# Patient Record
Sex: Female | Born: 1960 | Race: White | Hispanic: No | Marital: Married | Smoking: Never smoker
Health system: Southern US, Community
[De-identification: ages and names within clinical notes are randomized; demographics above are authoritative.]

## PROBLEM LIST (undated history)

## (undated) ENCOUNTER — Emergency Department: Admission: EM | Payer: Self-pay | Source: Home / Self Care

## (undated) DIAGNOSIS — N83209 Unspecified ovarian cyst, unspecified side: Secondary | ICD-10-CM

## (undated) DIAGNOSIS — I471 Supraventricular tachycardia, unspecified: Secondary | ICD-10-CM

## (undated) DIAGNOSIS — R112 Nausea with vomiting, unspecified: Secondary | ICD-10-CM

## (undated) DIAGNOSIS — Z0000 Encounter for general adult medical examination without abnormal findings: Secondary | ICD-10-CM

## (undated) DIAGNOSIS — F419 Anxiety disorder, unspecified: Secondary | ICD-10-CM

## (undated) DIAGNOSIS — I499 Cardiac arrhythmia, unspecified: Secondary | ICD-10-CM

## (undated) DIAGNOSIS — M545 Low back pain, unspecified: Secondary | ICD-10-CM

## (undated) DIAGNOSIS — M1990 Unspecified osteoarthritis, unspecified site: Secondary | ICD-10-CM

## (undated) DIAGNOSIS — N200 Calculus of kidney: Secondary | ICD-10-CM

## (undated) DIAGNOSIS — E039 Hypothyroidism, unspecified: Secondary | ICD-10-CM

## (undated) DIAGNOSIS — Z319 Encounter for procreative management, unspecified: Secondary | ICD-10-CM

## (undated) DIAGNOSIS — R519 Headache, unspecified: Secondary | ICD-10-CM

## (undated) DIAGNOSIS — F32A Depression, unspecified: Secondary | ICD-10-CM

## (undated) DIAGNOSIS — B009 Herpesviral infection, unspecified: Secondary | ICD-10-CM

## (undated) HISTORY — DX: Encounter for general adult medical examination without abnormal findings: Z00.00

## (undated) HISTORY — DX: Anxiety disorder, unspecified: F41.9

## (undated) HISTORY — DX: Herpesviral infection, unspecified: B00.9

## (undated) HISTORY — DX: Unspecified ovarian cyst, unspecified side: N83.209

## (undated) HISTORY — DX: Depression, unspecified: F32.A

## (undated) HISTORY — DX: Supraventricular tachycardia: I47.1

## (undated) HISTORY — DX: Supraventricular tachycardia, unspecified: I47.10

## (undated) HISTORY — DX: Encounter for procreative management, unspecified: Z31.9

---

## 2005-10-28 ENCOUNTER — Ambulatory Visit
Admit: 2005-10-28 | Disposition: A | Discharge status: Home / Self Care | Payer: Self-pay | Source: Ambulatory Visit | Admitting: Orthopaedic Surgery

## 2006-03-25 ENCOUNTER — Emergency Department: Admit: 2006-03-25 | Payer: Self-pay | Source: Emergency Department | Admitting: Emergency Medicine

## 2007-03-03 ENCOUNTER — Emergency Department: Admit: 2007-03-03 | Payer: Self-pay | Source: Emergency Department | Admitting: Emergency Medicine

## 2007-12-31 ENCOUNTER — Emergency Department: Admit: 2007-12-31 | Payer: Self-pay | Source: Emergency Department | Admitting: Emergency Medicine

## 2009-09-13 ENCOUNTER — Ambulatory Visit
Admit: 2009-09-13 | Disposition: A | Discharge status: Home / Self Care | Payer: Self-pay | Source: Ambulatory Visit | Admitting: Obstetrics & Gynecology

## 2010-03-15 ENCOUNTER — Ambulatory Visit: Admission: RE | Admit: 2010-03-15 | Payer: Self-pay | Source: Ambulatory Visit | Admitting: Orthopaedic Surgery

## 2010-04-17 ENCOUNTER — Emergency Department: Admit: 2010-04-17 | Payer: Self-pay | Source: Emergency Department | Admitting: Emergency Medicine

## 2010-05-26 ENCOUNTER — Ambulatory Visit
Admit: 2010-05-26 | Disposition: A | Discharge status: Home / Self Care | Payer: Self-pay | Source: Ambulatory Visit | Admitting: Physical Medicine & Rehabilitation

## 2011-06-03 ENCOUNTER — Ambulatory Visit
Admit: 2011-06-03 | Discharge: 2011-06-03 | Disposition: A | Discharge status: Home / Self Care | Payer: Self-pay | Source: Ambulatory Visit

## 2011-06-19 ENCOUNTER — Ambulatory Visit
Admit: 2011-06-19 | Discharge: 2011-06-19 | Disposition: A | Discharge status: Home / Self Care | Payer: Self-pay | Source: Ambulatory Visit

## 2011-09-05 ENCOUNTER — Ambulatory Visit
Admission: RE | Admit: 2011-09-05 | Payer: Self-pay | Source: Ambulatory Visit | Attending: Obstetrics & Gynecology | Admitting: Obstetrics & Gynecology

## 2011-09-13 NOTE — Op Note (Signed)
Gwendolyn [name], Gwendolyn [name]      MRN:          [id number]      Account:      [id number]      Document ID:  [id number]      Procedure Date: 03/15/2010            Admit Date: 03/15/2010            Patient Location: [facility]      Patient Type: A            SURGEON: Theador Hawthorne IV MD      ASSISTANT:                  PREOPERATIVE DIAGNOSIS:      Hallux rigidus, left foot first metatarsophalangeal joint.            POSTOPERATIVE DIAGNOSIS:      Hallux rigidus, left foot first metatarsophalangeal joint.            TITLE OF PROCEDURE:      Cheilectomy, left first metatarsophalangeal joint.            ASSISTANT:      Leanord Hawking.            ANESTHESIA:      Laryngeal mask anesthesia with Dr. Serena Croissant.            BRIEF HISTORY OF PRESENT ILLNESS:      The patient is a 50 year old white female with longstanding first MTP joint      hallux rigidus.  She now presents for a cheilectomy of her left first MTP      joint.            DESCRIPTION OF PROCEDURE:      The patient brought to the operating room, placed in supine position.      After adequate laryngeal mask anesthesia was obtained, the left lower      extremity was then prepped and draped in sterile fashion.  Esmarch bandage      was used to exsanguinate the left lower extremity, and tourniquet was      inflated 250 mmHg with a calf tourniquet.  A 4-cm incision was made      directly over the first MTP joint with a 15 blade.  Dissection was carried      down through subcutaneous tissue bluntly with Stevens tenotomy scissors.      The extensor tendon was retracted laterally exposing the underlying joint      capsule.  This was incised longitudinally with a 15 blade, exposing the      joint.  There were significant dorsal osteophytes on the first metatarsal      head.  The straight osteotome was then used to remove the dorsal      osteophytes and then an osteoplasty was performed with the entire first      metatarsal head as well as the base of the proximal phalanx.  The joint       surface actually looked surprisingly good without that much arthritis, but      all bone spurs were removed with a rongeur.  There is no impingement with                                   Page 1 of 2      [name], Gwendolyn  MRN:          [id number]      Account:      [id number]      Document ID:  [id number]      Procedure Date: 03/15/2010            dorsiflexion of the first MTP joint after this was completed. Wound was      irrigated with saline, the capsulotomy was repaired with 3-0 Monocryl,      subcutaneous tissues were reapproximated with 3-0 and 4-0 Monocryl,      Steri-Strips applied.  Wound was injected with 5 mL of 0.25% Marcaine      plain.  Sterile compression dressing applied.  Tourniquet was deflated.      Total tourniquet time was 24 minutes, taken to the recovery room in stable      condition.                        Electronic Signing Provider            D:  03/15/2010 13:15 PM by Dr. Sidney Ace, MD [phone])      T:  03/15/2010 15:19 PM by [id number]                  cc:Shaneeka Scarboro Verita Lamb IV MD                                   Page 2 of 2      Authenticated by Theador Hawthorne, MD On 03/22/2010 10:39:30 AM

## 2011-09-25 NOTE — Op Note (Signed)
Gwendolyn [name], Gwendolyn [name]      MRN:          [id number]      Account:      [id number]      Document ID:  [id number]      Procedure Date: 09/05/2011            Admit Date: 09/05/2011            Patient Location: [facility]      Patient Type: A            SURGEON: Waldon Merl MD      ASSISTANT:                  PREOPERATIVE DIAGNOSIS:      Persistent right ovarian cyst, pelvic pain.            POSTOPERATIVE DIAGNOSES:      Persistent right ovarian cyst, pelvic pain.            TITLE OF PROCEDURE:      Operative laparoscopy with aspiration of right ovarian cystic fluid.            ANESTHESIA:      General.            FINDINGS:      Uterus sounds to 7 cm, normal in appearance.  The left tube and ovary were      normal.  The right tube was normal.  The right ovary with ovarian cyst      surrounded by normal-appearing ovarian tissue.            ESTIMATED BLOOD LOSS:      Minimum.            DRAINS:      None.            DESCRIPTION OF PROCEDURE:      The patient taken to the operating room, placed on table in supine      position.  Anesthesia was initiated.  She was then placed in lithotomy      position, prepped, draped and in and out catheterized in the usual manner.      Speculum was placed in the vagina.  Single-tooth tenaculum was used to      grasp the anterior lip of the cervix.  Uterus was sounded.  Cervix was      dilated to admit a uterine manipulator, which was then placed.  The      single-tooth tenaculum and speculum were removed.  Attention was turned to      the abdomen.  The patient placed in Trendelenburg.  A small incision was      made in the infraumbilical region, through this a Veress needle was placed.       Several attempts failed as the abdominal wall adipose was too thick.       Upon using a long trocar sleeve with the laparoscope, we were able to      penetrate through the subcutaneous and fascia and peritoneum.  The trocar                                   Page 1 of 2      Gwendolyn [name], Gwendolyn [name]      MRN:           [id number]      Account:      [id number]  Document ID:  [id number]      Procedure Date: 09/05/2011            was removed, pneumoperitoneum was created using CO2.  Second and third      incisions were made in the right and left lower quadrant.  Through these 5      mm trocar sleeves were placed.  The pelvis was visualized in a clockwise      fashion, findings as previously noted.  As there is noted to be a good      amount of ovarian tissue, the cyst appeared to be centralized.  The      decision was made to aspirate the cyst, which was performed.  A total of 50      mL of clear fluid was removed, and the ovary deflated and appeared normal.      At this point, the laparoscopy was terminated, aspirator was removed.  The      sleeves were removed under laparoscopic guidance on the right and left      lower quadrants, laparoscope was removed.  The CO2 was allowed to escape,      followed by removal of the infraumbilical sleeve.  The incisions were      repaired using 4-0 Monocryl subcuticular stitches.  A speculum was placed.      The uterine manipulator was removed.  Area of bleeding on the cervix was      cauterized using silver nitrate stick.  The vagina was wiped clean, vaginal      sweep.  The instrument and gauze counts were correct.  The patient      tolerated the procedure well, was transferred to recovery room in      satisfactory condition.                        Electronic Signing Provider            D:  09/05/2011 10:43 AM by Dr. Waldon Merl, MD [phone])      T:  09/05/2011 11:26 AM by [id number]                  cc:                                   Page 2 of 2      Authenticated and Edited by Waldon Merl, MD [phone]) On 09/09/11 7:44:52 PM

## 2013-09-27 ENCOUNTER — Other Ambulatory Visit: Payer: Self-pay | Admitting: Otolaryngology

## 2013-09-27 DIAGNOSIS — D333 Benign neoplasm of cranial nerves: Secondary | ICD-10-CM

## 2013-10-02 ENCOUNTER — Ambulatory Visit: Payer: Commercial Managed Care - HMO

## 2014-06-16 ENCOUNTER — Other Ambulatory Visit: Payer: Self-pay | Admitting: Foot & Ankle Surgery

## 2014-06-16 DIAGNOSIS — S93402A Sprain of unspecified ligament of left ankle, initial encounter: Secondary | ICD-10-CM

## 2014-06-18 ENCOUNTER — Ambulatory Visit: Payer: No Typology Code available for payment source | Attending: Foot & Ankle Surgery

## 2014-06-18 DIAGNOSIS — M659 Synovitis and tenosynovitis, unspecified: Secondary | ICD-10-CM | POA: Insufficient documentation

## 2014-06-18 DIAGNOSIS — R609 Edema, unspecified: Secondary | ICD-10-CM | POA: Insufficient documentation

## 2014-06-18 DIAGNOSIS — S93429A Sprain of deltoid ligament of unspecified ankle, initial encounter: Secondary | ICD-10-CM | POA: Insufficient documentation

## 2014-06-18 DIAGNOSIS — S93499A Sprain of other ligament of unspecified ankle, initial encounter: Secondary | ICD-10-CM | POA: Insufficient documentation

## 2014-06-18 DIAGNOSIS — M65979 Unspecified synovitis and tenosynovitis, unspecified ankle and foot: Secondary | ICD-10-CM | POA: Insufficient documentation

## 2014-06-18 DIAGNOSIS — S93419A Sprain of calcaneofibular ligament of unspecified ankle, initial encounter: Secondary | ICD-10-CM | POA: Insufficient documentation

## 2014-06-18 DIAGNOSIS — M7730 Calcaneal spur, unspecified foot: Secondary | ICD-10-CM | POA: Insufficient documentation

## 2014-06-18 DIAGNOSIS — M799 Soft tissue disorder, unspecified: Secondary | ICD-10-CM | POA: Insufficient documentation

## 2014-06-18 DIAGNOSIS — S93402A Sprain of unspecified ligament of left ankle, initial encounter: Secondary | ICD-10-CM

## 2014-06-18 DIAGNOSIS — M259 Joint disorder, unspecified: Secondary | ICD-10-CM | POA: Insufficient documentation

## 2014-09-20 ENCOUNTER — Encounter (INDEPENDENT_AMBULATORY_CARE_PROVIDER_SITE_OTHER): Payer: Self-pay | Admitting: Internal Medicine

## 2014-10-06 ENCOUNTER — Ambulatory Visit (INDEPENDENT_AMBULATORY_CARE_PROVIDER_SITE_OTHER): Payer: No Typology Code available for payment source | Admitting: Internal Medicine

## 2014-11-14 ENCOUNTER — Encounter: Payer: Self-pay | Admitting: Obstetrics & Gynecology

## 2014-11-16 ENCOUNTER — Ambulatory Visit (INDEPENDENT_AMBULATORY_CARE_PROVIDER_SITE_OTHER): Payer: HMO | Admitting: Internal Medicine

## 2014-11-16 ENCOUNTER — Ambulatory Visit: Payer: HMO

## 2014-11-16 ENCOUNTER — Other Ambulatory Visit: Payer: HMO

## 2014-11-16 ENCOUNTER — Encounter (INDEPENDENT_AMBULATORY_CARE_PROVIDER_SITE_OTHER): Payer: Self-pay | Admitting: Internal Medicine

## 2014-11-16 VITALS — BP 117/78 | HR 65 | Temp 99.1°F | Ht 65.0 in | Wt 229.0 lb

## 2014-11-16 DIAGNOSIS — Z01818 Encounter for other preprocedural examination: Secondary | ICD-10-CM

## 2014-11-16 DIAGNOSIS — K279 Peptic ulcer, site unspecified, unspecified as acute or chronic, without hemorrhage or perforation: Secondary | ICD-10-CM

## 2014-11-16 NOTE — Progress Notes (Signed)
Subjective:       Patient ID: Gwendolyn [name] is a 53 y.o. female.    HPI Comments: 53 year old in for pre op for operative Laparoscopy and BSO    Allergies:    -- Beef-Derived Products -- Anaphylaxis   -- Erythromycin -- Hives   -- Other     --  Bee sting   -- Penicillin V -- Hives    Medications:     zoloft 10 mg daily  Diamox 125 mg prn  Verapamil 40 mg  Daily  Klonopin 1mg  prn  Armour thyroid daily  Zanaflex 4 mg prn  Vit D 1000 mg     PMHx:  Past Medical History:    Disorder of thyroid                                           Anxiety                                                       Depression                                                    Back pain                                                     SVT (supraventricular tachycardia)                            Herpes                                                        Ovarian cyst                                                       PSHx  Past Surgical History:    REDUCTION MAMMAPLASTY                                          FOOT SURGERY                                                 Ovary Cyst Surgery    Social Hx  Smoker-none  Drugs- none  Alcohol-socially    Family history:   Review of patient's family history indicates:  Hyperlipidemia                 Mother                    Hyperlipidemia                 Father                    Skin cancer                    Sister                    Breast cancer                  Sister                    Diabetes                       Sister                    Hyperlipidemia                 Sister                    Heart attack                   Brother                   Hyperlipidemia                 Brother                   Breast cancer                  Maternal Aunt             Cervical cancer                Maternal Aunt             Lung cancer                    Maternal Uncle            Brain cancer                   Paternal Uncle            Diabetes                       Maternal Grandmother       Diabetes                       Paternal Grandfather          The following portions of the patient's history were reviewed and updated as appropriate: allergies, current medications, past family history, past medical history, past social history, past surgical history and problem list.    Review of Systems   Constitutional: Negative for diaphoresis, fatigue and unexpected weight change.   HENT: Negative for congestion, dental problem, ear pain, hearing loss, rhinorrhea, sinus pressure, sneezing, sore throat and trouble swallowing.    Eyes: Negative for pain and visual disturbance.   Respiratory: Negative for apnea, cough, chest tightness and shortness of breath.  Cardiovascular: Negative for chest pain, palpitations and leg swelling.   Gastrointestinal: Negative for nausea, vomiting, abdominal pain, diarrhea, constipation and blood in stool.   Genitourinary: Negative for dysuria, urgency, frequency, hematuria, decreased urine volume, vaginal discharge, difficulty urinating, vaginal pain, pelvic pain and dyspareunia.   Musculoskeletal: Negative for myalgias and arthralgias.   Skin: Negative for color change and rash.   Neurological: Negative for dizziness, tremors, weakness and headaches.   Hematological: Negative for adenopathy.   Psychiatric/Behavioral: Negative for suicidal ideas, sleep disturbance, self-injury and agitation.           Objective:    Physical Exam   Constitutional: She is oriented to person, place, and time. She appears well-developed and well-nourished.   HENT:   Head: Normocephalic and atraumatic.   Right Ear: External ear normal.   Left Ear: External ear normal.   Nose: Nose normal.   Mouth/Throat: Oropharynx is clear and moist.   Eyes: EOM are normal. Right eye exhibits no discharge. Left eye exhibits no discharge.   Neck: Normal range of motion. Neck supple. No JVD present. No thyromegaly present.   Cardiovascular: Normal rate, regular rhythm, normal heart sounds and intact distal  pulses.    No murmur heard.  Pulmonary/Chest: Effort normal and breath sounds normal.   Abdominal: Soft. Bowel sounds are normal. She exhibits no distension and no mass. There is no rebound and no guarding.   Musculoskeletal: Normal range of motion. She exhibits no edema or tenderness.   Lymphadenopathy:     She has no cervical adenopathy.   Neurological: She is oriented to person, place, and time. She has normal reflexes.   Skin: Skin is warm and dry.   Psychiatric: She has a normal mood and affect. Her behavior is normal.   Nursing note and vitals reviewed.          Assessment:       53 year old in for pre op for operative laparoscopy and BSO      Plan:      Procedures    Exam Normal  Labs pending will fax once in  If benefits of surgery outweigh risks then ok to proceed with normal risk/benefits explained to patient

## 2014-11-17 LAB — CBC
Hematocrit: 44.1 % (ref 34.0–46.6)
Hemoglobin: 14.7 g/dL (ref 11.1–15.9)
MCH: 29.9 pg (ref 26.6–33.0)
MCHC: 33.3 g/dL (ref 31.5–35.7)
MCV: 90 fL (ref 79–97)
Platelets: 305 10*3/uL (ref 150–379)
RBC: 4.91 x10E6/uL (ref 3.77–5.28)
RDW: 14.6 % (ref 12.3–15.4)
WBC: 8.3 10*3/uL (ref 3.4–10.8)

## 2014-11-17 LAB — COMPREHENSIVE METABOLIC PANEL
ALT: 19 IU/L (ref 0–32)
AST (SGOT): 16 IU/L (ref 0–40)
Albumin/Globulin Ratio: 1.7 (ref 1.1–2.5)
Albumin: 4.5 g/dL (ref 3.5–5.5)
Alkaline Phosphatase: 114 IU/L (ref 39–117)
BUN / Creatinine Ratio: 20 (ref 9–23)
BUN: 17 mg/dL (ref 6–24)
Bilirubin, Total: 0.4 mg/dL (ref 0.0–1.2)
CO2: 22 mmol/L (ref 18–29)
Calcium: 9.8 mg/dL (ref 8.7–10.2)
Chloride: 102 mmol/L (ref 97–108)
Creatinine: 0.84 mg/dL (ref 0.57–1.00)
EGFR: 81 mL/min/{1.73_m2} (ref >59)
EGFR: 93 mL/min/{1.73_m2} (ref >59)
Globulin, Total: 2.7 g/dL (ref 1.5–4.5)
Glucose: 85 mg/dL (ref 65–99)
Potassium: 4.2 mmol/L (ref 3.5–5.2)
Protein, Total: 7.2 g/dL (ref 6.0–8.5)
Sodium: 142 mmol/L (ref 134–144)

## 2014-11-17 LAB — HCG, SERUM, QUALITATIVE: HCG, Beta SubUnit, Qualitative: NEGATIVE m[IU]/mL (ref <6)

## 2014-11-17 LAB — HEPATITIS B SURFACE ANTIBODY: HEPATITIS B SURFACE ANTIBODY: <3.1 m[IU]/mL — ABNORMAL LOW (ref >9.9)

## 2014-11-18 LAB — HIV-1/2 AG/AB 4TH GEN. W/ REFLEX: HIV Screen 4th Generation wRfx: NONREACTIVE

## 2014-11-22 ENCOUNTER — Ambulatory Visit
Admission: RE | Admit: 2014-11-22 | Discharge: 2014-11-22 | Disposition: A | Discharge status: Home / Self Care | Payer: HMO | Source: Ambulatory Visit | Attending: Obstetrics & Gynecology | Admitting: Obstetrics & Gynecology

## 2014-11-22 ENCOUNTER — Ambulatory Visit: Payer: Self-pay

## 2014-11-22 ENCOUNTER — Ambulatory Visit: Payer: HMO | Admitting: Pain Medicine

## 2014-11-22 ENCOUNTER — Ambulatory Visit: Payer: No Typology Code available for payment source | Admitting: Obstetrics & Gynecology

## 2014-11-22 ENCOUNTER — Encounter
Admission: RE | Disposition: A | Discharge status: Home / Self Care | Payer: Self-pay | Source: Ambulatory Visit | Attending: Obstetrics & Gynecology

## 2014-11-22 DIAGNOSIS — N8300 Follicular cyst of ovary, unspecified side: Secondary | ICD-10-CM | POA: Diagnosis not present

## 2014-11-22 DIAGNOSIS — R102 Pelvic and perineal pain: Secondary | ICD-10-CM | POA: Insufficient documentation

## 2014-11-22 DIAGNOSIS — E039 Hypothyroidism, unspecified: Secondary | ICD-10-CM | POA: Insufficient documentation

## 2014-11-22 DIAGNOSIS — N736 Female pelvic peritoneal adhesions (postinfective): Secondary | ICD-10-CM | POA: Insufficient documentation

## 2014-11-22 DIAGNOSIS — N83201 Unspecified ovarian cyst, right side: Secondary | ICD-10-CM | POA: Diagnosis present

## 2014-11-22 HISTORY — DX: Cardiac arrhythmia, unspecified: I49.9

## 2014-11-22 HISTORY — DX: Low back pain, unspecified: M54.50

## 2014-11-22 HISTORY — DX: Nausea with vomiting, unspecified: R11.2

## 2014-11-22 HISTORY — DX: Calculus of kidney: N20.0

## 2014-11-22 HISTORY — DX: Hypothyroidism, unspecified: E03.9

## 2014-11-22 SURGERY — LAPAROSCOPIC, OPERATIVE
Anesthesia: Anesthesia General | Site: Pelvis | Wound class: Clean Contaminated

## 2014-11-22 MED ORDER — ONDANSETRON HCL 4 MG/2ML IJ SOLN
INTRAMUSCULAR | Status: DC | PRN
Start: 2014-11-22 — End: 2014-11-22
  Administered 2014-11-22: 4 mg via INTRAVENOUS

## 2014-11-22 MED ORDER — GENTAMICIN SULFATE 40 MG/ML IJ SOLN
2.0000 mg/kg | INTRAMUSCULAR | Status: DC
Start: 2014-11-22 — End: 2014-11-22
  Administered 2014-11-22: 160 mg via INTRAVENOUS
  Filled 2014-11-22: qty 4

## 2014-11-22 MED ORDER — NEOSTIGMINE METHYLSULFATE 1 MG/ML IJ SOLN
INTRAMUSCULAR | Status: AC
Start: 2014-11-22 — End: ?
  Filled 2014-11-22: qty 10

## 2014-11-22 MED ORDER — FENTANYL CITRATE 0.05 MG/ML IJ SOLN
INTRAMUSCULAR | Status: AC
Start: 2014-11-22 — End: ?
  Filled 2014-11-22: qty 2

## 2014-11-22 MED ORDER — ONDANSETRON HCL 4 MG/2ML IJ SOLN
INTRAMUSCULAR | Status: DC
Start: 2014-11-22 — End: 2014-11-22
  Filled 2014-11-22: qty 2

## 2014-11-22 MED ORDER — FENTANYL CITRATE 0.05 MG/ML IJ SOLN
INTRAMUSCULAR | Status: DC | PRN
Start: 2014-11-22 — End: 2014-11-22
  Administered 2014-11-22: 100 ug via INTRAVENOUS

## 2014-11-22 MED ORDER — MIDAZOLAM HCL 2 MG/2ML IJ SOLN
INTRAMUSCULAR | Status: DC | PRN
Start: 2014-11-22 — End: 2014-11-22
  Administered 2014-11-22: 2 mg via INTRAVENOUS

## 2014-11-22 MED ORDER — KETOROLAC TROMETHAMINE 30 MG/ML IJ SOLN
30.0000 mg | Freq: Four times a day (QID) | INTRAMUSCULAR | Status: DC | PRN
Start: 2014-11-22 — End: 2014-11-22
  Administered 2014-11-22: 30 mg via INTRAVENOUS

## 2014-11-22 MED ORDER — LACTATED RINGERS IV SOLN
INTRAVENOUS | Status: DC | PRN
Start: 2014-11-22 — End: 2014-11-22

## 2014-11-22 MED ORDER — ONDANSETRON HCL 4 MG/2ML IJ SOLN
INTRAMUSCULAR | Status: AC
Start: 2014-11-22 — End: ?
  Filled 2014-11-22: qty 2

## 2014-11-22 MED ORDER — DEXAMETHASONE SOD PHOSPHATE PF 10 MG/ML IJ SOLN
INTRAMUSCULAR | Status: AC
Start: 2014-11-22 — End: ?
  Filled 2014-11-22: qty 1

## 2014-11-22 MED ORDER — EPHEDRINE SULFATE 50 MG/ML IJ SOLN
INTRAMUSCULAR | Status: AC
Start: 2014-11-22 — End: ?
  Filled 2014-11-22: qty 1

## 2014-11-22 MED ORDER — HYDROMORPHONE HCL 1 MG/ML IJ SOLN
INTRAMUSCULAR | Status: DC
Start: 2014-11-22 — End: 2014-11-22
  Filled 2014-11-22: qty 1

## 2014-11-22 MED ORDER — ROCURONIUM BROMIDE 50 MG/5ML IV SOLN
INTRAVENOUS | Status: AC
Start: 2014-11-22 — End: ?
  Filled 2014-11-22: qty 5

## 2014-11-22 MED ORDER — SCOPOLAMINE 1 MG/3DAYS TD PT72
1.0000 | MEDICATED_PATCH | Freq: Once | TRANSDERMAL | Status: DC
Start: 2014-11-22 — End: 2014-11-22
  Administered 2014-11-22: 1 via TRANSDERMAL

## 2014-11-22 MED ORDER — SUCCINYLCHOLINE CHLORIDE 20 MG/ML IJ SOLN
INTRAMUSCULAR | Status: DC | PRN
Start: 2014-11-22 — End: 2014-11-22
  Administered 2014-11-22: 100 mg via INTRAVENOUS

## 2014-11-22 MED ORDER — HYDROMORPHONE HCL 1 MG/ML IJ SOLN
0.5000 mg | INTRAMUSCULAR | Status: AC | PRN
Start: 2014-11-22 — End: 2014-11-22
  Administered 2014-11-22 (×4): 0.5 mg via INTRAVENOUS

## 2014-11-22 MED ORDER — BUPIVACAINE-EPINEPHRINE (PF) 0.5% -1:200000 IJ SOLN
INTRAMUSCULAR | Status: AC
Start: 2014-11-22 — End: ?
  Filled 2014-11-22: qty 30

## 2014-11-22 MED ORDER — CLINDAMYCIN PHOSPHATE IN D5W 900 MG/50ML IV SOLN
900.0000 mg | INTRAVENOUS | Status: DC
Start: 2014-11-22 — End: 2014-11-22
  Administered 2014-11-22: 900 mg via INTRAVENOUS

## 2014-11-22 MED ORDER — OXYCODONE-ACETAMINOPHEN 5-325 MG PO TABS
1.00 | ORAL_TABLET | ORAL | Status: DC | PRN
Start: 2014-11-22 — End: 2015-03-29

## 2014-11-22 MED ORDER — PROPOFOL INFUSION 10 MG/ML
INTRAVENOUS | Status: DC | PRN
Start: 2014-11-22 — End: 2014-11-22
  Administered 2014-11-22: 200 mg via INTRAVENOUS

## 2014-11-22 MED ORDER — GLYCOPYRROLATE 0.2 MG/ML IJ SOLN
INTRAMUSCULAR | Status: AC
Start: 2014-11-22 — End: ?
  Filled 2014-11-22: qty 2

## 2014-11-22 MED ORDER — IBUPROFEN 200 MG PO TABS
600.00 mg | ORAL_TABLET | Freq: Four times a day (QID) | ORAL | Status: DC | PRN
Start: 2014-11-22 — End: 2015-03-29

## 2014-11-22 MED ORDER — SUCCINYLCHOLINE CHLORIDE 20 MG/ML IJ SOLN
INTRAMUSCULAR | Status: AC
Start: 2014-11-22 — End: ?
  Filled 2014-11-22: qty 10

## 2014-11-22 MED ORDER — HYDROMORPHONE HCL 1 MG/ML IJ SOLN
INTRAMUSCULAR | Status: AC
Start: 2014-11-22 — End: ?
  Filled 2014-11-22: qty 1

## 2014-11-22 MED ORDER — NEOSTIGMINE METHYLSULFATE 1 MG/ML IJ SOLN
INTRAMUSCULAR | Status: DC | PRN
Start: 2014-11-22 — End: 2014-11-22
  Administered 2014-11-22: 3 mg via INTRAVENOUS

## 2014-11-22 MED ORDER — HYDROMORPHONE HCL 1 MG/ML IJ SOLN
0.4000 mg | INTRAMUSCULAR | Status: DC | PRN
Start: 2014-11-22 — End: 2014-11-22
  Administered 2014-11-22 (×3): 0.4 mg via INTRAVENOUS

## 2014-11-22 MED ORDER — GLYCOPYRROLATE 0.2 MG/ML IJ SOLN
INTRAMUSCULAR | Status: DC | PRN
Start: 2014-11-22 — End: 2014-11-22
  Administered 2014-11-22: 0.4 mg via INTRAVENOUS

## 2014-11-22 MED ORDER — OXYCODONE-ACETAMINOPHEN 5-325 MG PO TABS
1.0000 | ORAL_TABLET | Freq: Once | ORAL | Status: AC | PRN
Start: 2014-11-22 — End: 2014-11-22
  Administered 2014-11-22: 1 via ORAL

## 2014-11-22 MED ORDER — DEXAMETHASONE SODIUM PHOSPHATE 4 MG/ML IJ SOLN (WRAP)
INTRAMUSCULAR | Status: DC | PRN
Start: 2014-11-22 — End: 2014-11-22
  Administered 2014-11-22: 8 mg via INTRAVENOUS

## 2014-11-22 MED ORDER — MEPERIDINE HCL 25 MG/ML IJ SOLN
12.5000 mg | INTRAMUSCULAR | Status: DC | PRN
Start: 2014-11-22 — End: 2014-11-22

## 2014-11-22 MED ORDER — FENTANYL CITRATE 0.05 MG/ML IJ SOLN
INTRAMUSCULAR | Status: DC
Start: 2014-11-22 — End: 2014-11-22
  Filled 2014-11-22: qty 2

## 2014-11-22 MED ORDER — FENTANYL CITRATE 0.05 MG/ML IJ SOLN
25.0000 ug | INTRAMUSCULAR | Status: AC | PRN
Start: 2014-11-22 — End: 2014-11-22
  Administered 2014-11-22 (×4): 25 ug via INTRAVENOUS

## 2014-11-22 MED ORDER — KETOROLAC TROMETHAMINE 30 MG/ML IJ SOLN
INTRAMUSCULAR | Status: DC
Start: 2014-11-22 — End: 2014-11-22
  Filled 2014-11-22: qty 1

## 2014-11-22 MED ORDER — OXYCODONE-ACETAMINOPHEN 5-325 MG PO TABS
ORAL_TABLET | ORAL | Status: AC
Start: 2014-11-22 — End: ?
  Filled 2014-11-22: qty 1

## 2014-11-22 MED ORDER — BUPIVACAINE-EPINEPHRINE (PF) 0.5% -1:200000 IJ SOLN
INTRAMUSCULAR | Status: DC | PRN
Start: 2014-11-22 — End: 2014-11-22
  Administered 2014-11-22: 14 mL via INTRAMUSCULAR

## 2014-11-22 MED ORDER — CLINDAMYCIN PHOSPHATE IN D5W 900 MG/50ML IV SOLN
INTRAVENOUS | Status: AC
Start: 2014-11-22 — End: ?
  Filled 2014-11-22: qty 50

## 2014-11-22 MED ORDER — SCOPOLAMINE 1 MG/3DAYS TD PT72
MEDICATED_PATCH | TRANSDERMAL | Status: AC
Start: 2014-11-22 — End: ?
  Filled 2014-11-22: qty 1

## 2014-11-22 MED ORDER — PROPOFOL 10 MG/ML IV EMUL
INTRAVENOUS | Status: AC
Start: 2014-11-22 — End: ?
  Filled 2014-11-22: qty 20

## 2014-11-22 MED ORDER — MIDAZOLAM HCL 2 MG/2ML IJ SOLN
INTRAMUSCULAR | Status: AC
Start: 2014-11-22 — End: ?
  Filled 2014-11-22: qty 2

## 2014-11-22 MED ORDER — ONDANSETRON HCL 4 MG/2ML IJ SOLN
4.0000 mg | Freq: Once | INTRAMUSCULAR | Status: AC | PRN
Start: 2014-11-22 — End: 2014-11-22
  Administered 2014-11-22: 4 mg via INTRAVENOUS

## 2014-11-22 MED ORDER — PROMETHAZINE HCL 25 MG/ML IJ SOLN
6.2500 mg | Freq: Once | INTRAMUSCULAR | Status: DC | PRN
Start: 2014-11-22 — End: 2014-11-22

## 2014-11-22 MED ORDER — ROCURONIUM BROMIDE 50 MG/5ML IV SOLN
INTRAVENOUS | Status: DC | PRN
Start: 2014-11-22 — End: 2014-11-22
  Administered 2014-11-22: 40 mg via INTRAVENOUS

## 2014-11-22 SURGICAL SUPPLY — 33 items
BANDAGE STERI-STRIP 1/4INX4IN (Dressing) ×1
GLOVE SURG BIOGEL ORTHO SZ7 (Glove) ×3 IMPLANT
GOWN OPTIMA STRL BACK OR (Gown) ×3 IMPLANT
MANIPULATOR UTERIME KRONNR 5MM (Laparoscopy Supplies) ×1
MANIPULATOR UTERINE OD5 MM ENDOSCOPIC (Laparoscopy Supplies) ×2
MANIPULATOR UTERINE OD5 MM ENDOSCOPIC KRONNER MANIPUJECTOR CATHETER (Laparoscopy Supplies) ×2 IMPLANT
MASTISOL VIAL 2/3CC STRL (Skin Closure) ×1 IMPLANT
PAD PREP CUFF 24X41IN W 9IN (Prep) ×3 IMPLANT
POUCH ENDO CATCH 10MM GOLD (Laparoscopy Supplies)
POUCH SPECIMEN RETRIEVAL L34.5 CM (Laparoscopy Supplies)
POUCH SPECIMEN RETRIEVAL L34.5 CM ERGONOMIC HANDLE LONG CYLINDRICAL (Laparoscopy Supplies) IMPLANT
SCISSOR ENDOCUT DISP LAPO (Instrument) IMPLANT
SEALER LAPARSCOPIC DIVIDER 5MM (Laparoscopy Supplies) IMPLANT
SOL NACL .9% 1000ML LATEX (IV Solutions) ×1
SOL NACL .9% IRRIG 500ML (Irrigation Solutions) ×1
SOLUTION IRRIGATION 0.9% SDM CHLORIDE 500ML PR BTTL ISOTONIC NONPRGNC (Irrigation Solutions) ×2 IMPLANT
SOLUTION IRRIGATION 0.9% SODIUM CHLORIDE (Irrigation Solutions) ×2
SOLUTION IV 0.9% SODIUM CHLORIDE PVC (IV Solutions) ×2
SOLUTION IV 0.9% SODIUM CHLORIDE PVC 1000 ML PH 5 PLASTIC CONTAINER (IV Solutions) ×2 IMPLANT
STRIP SKIN CLOSURE L4 IN X W1/4 IN (Dressing) ×2
STRIP SKIN CLOSURE L4 IN X W1/4 IN REINFORCE STERI-STRIP POLYESTER (Dressing) IMPLANT
SUCTION IRRIGATOR 2 WITH TIP (Suction) ×3 IMPLANT
SUTURE MONOCRYL 4-0 PS2 27IN (Suture) ×3 IMPLANT
SUTURE VICRYL 4-0 PS2 27IN (Suture) IMPLANT
TRAY LAPAROSCOPY GYN (Pack) ×3 IMPLANT
TROCAR BLADELESS ENDO 5X100MM (Laparoscopy Supplies) ×6 IMPLANT
TROCAR ENDOPATH XCEL DIL TIP (Laparoscopy Supplies)
TROCAR LAPAROSCOPIC L150 MM OD5 MM STABILITY SLEEVE BLADELESS (Laparoscopy Supplies) IMPLANT
TROCAR LAPAROSCOPIC STABILITY SLEEVE (Laparoscopy Supplies) ×6
TROCAR LAPAROSCOPIC STABILITY SLEEVE DILATE TIP L100 MM OD12 MM (Laparoscopy Supplies) IMPLANT
TROCAR XCEL SLV 5X150MM (Laparoscopy Supplies) ×3
TUBING INSUFFLATION LUER CONNECTOR FILTER HIGH FLOW CLEANFLOW SU1101 (Respiratory Supplies) ×2 IMPLANT
TUBING INSUFFLATION W/CO2 GUAR (Respiratory Supplies) ×3

## 2014-11-22 NOTE — Transfer of Care (Signed)
Anesthesia Transfer of Care Note    Patient: Gwendolyn [name]    Procedures performed: Procedure(s):  LAPAROSCOPY, DIAGNOSTIC,     Anesthesia type: General ETT    Patient location:Phase I PACU    Last vitals:   Filed Vitals:    11/22/14 1323   BP: 125/73   Pulse: 79   Temp: 36.3 C (97.3 F)   Resp: 12   SpO2:        Post pain: Patient not complaining of pain, continue current therapy      Mental Status:awake    Respiratory Function: tolerating face mask    Cardiovascular: stable    Nausea/Vomiting: patient not complaining of nausea or vomiting    Hydration Status: adequate    Post assessment: no apparent anesthetic complications and no reportable events

## 2014-11-22 NOTE — Anesthesia Postprocedure Evaluation (Signed)
Anesthesia Post Evaluation    Patient: Gwendolyn [name]    Procedures performed: Procedure(s):  LAPAROSCOPY, DIAGNOSTIC, CYSTOTOMY    Anesthesia type: General ETT    Patient location:Phase I PACU    Last vitals:   Filed Vitals:    11/22/14 1415   BP: 121/74   Pulse: 60   Temp:    Resp: 17   SpO2: 94%       Post pain: Continue adjustment of pain medication; Add toradol and additional dilaudid     Mental Status:awake    Respiratory Function: tolerating nasal cannula    Cardiovascular: stable    Nausea/Vomiting: patient not complaining of nausea or vomiting    Hydration Status: adequate    Post assessment: no apparent anesthetic complications

## 2014-11-22 NOTE — H&P (Signed)
There has been no interval changes in the H&P, patient has been examined.

## 2014-11-22 NOTE — Discharge Instructions (Addendum)
SCOPOLAMINE PATCH INSTRUCTION SHEET    You have had a scopolamine patch placed behind your right or left ear to help prevent or alleviate nausea and vomiting after your surgery.      Keep the patch dry to prevent it from falling off.    Remove the patch in the morning the day after your surgery.     After removing the patch, be sure to wash your hands and the area behind your ear with soap and water.    To avoid accidental contact or ingestion by children or pets, fold the used patch in half with the sticky side together and dispose in a secure trash bin.    If you are still having nausea and/or vomiting the day after your surgery, contact your physician.    If you have bothersome symptoms related to the patch such as dry mouth, blurry vision, confusion, dizziness, excessive drowsiness, or local rash, you may remove the patch.  If symptoms persist or you have questions about your symptoms, contact your physician.      POST OPERATIVE LAPAROSCOPY INSTRUCTIONS    After arriving home from the hospital, take it easy for at least the remainder of the day. Have someone in the house with you for at least 24 hours after surgery.  As a result of the surgery and anesthesia, you may experience one or more of the following symptoms:     General body discomfort - including mild shoulder, abdominal and neck pain for about 24 hours.   Abdominal discomfort and bloating may last several days because of the gas used to inflate the abdomen.   Tylenol, or Ibuprofen usually relieves the discomfort, but if needed, a prescription for a pain reliever may  be given.     Sore Throat - Salt water gargles and anesthetic throat lozenges, such as Chloraseptic or Cepacol, will  usually relieve this symptom.     Mild Bleeding from the Vagina - usually lasts 1-3 days, if you did not have a D & C also.  Use sanitary    pads - DO NOT USE TAMPONS OR DOUCHE.    After 24 hours, carefully remove the bandages covering the abdominal incisions and gently  clean the area with soap and water while showering.  You may have small strips of tape under the bandaids. Leave them over the incisions for at least 5 days. Do not scrub.  If the incisions are sore, Neosporin ointment may be used to relieve discomfort. Dissolvable sutures are always used, so suture removal is not necessary.  Normal activity can usually be resumed within 24-48 hours, other than the following:     No lifting more than 20 pounds for about a week.   Avoid tub baths, hot tubs, and swimming pools for a week.   No sexual activity until after the bleeding has completely stopped.    If you have not already made one, please call the office within two to three days after surgery to schedule a post-operative appointment in 2 weeks.    CALL IMMEDIATELY IF ANY OF THE FOLLOWING SYMPTOMS OCCUR:     Severe abdominal pain, with or without nausea and vomiting.   Unexplained fever greater than 100.5 degrees.   Redness, swelling, or drainage from the incision.   Heavy bleeding from the vagina - use of more than one pad per hour.    SPECIAL INSTRUCTIONS:        Drs. Sheila Oats, [address], [place], [place],  Post Anesthesia Discharge Instructions    Although you may be awake and alert in the recovery room, small amounts of anesthetic remain in your system for about 24 hours.  You may feel tired and sleepy during this time.      You are advised to go directly home from the hospital.    Plan to stay at home and rest for the remainder of the day.    It is advisable to have someone with you at home for 24 hours after surgery.    Do not operate a motor vehicle, or any mechanical or electrical equipment for the next 24 hours.      Be careful when you are walking around, you may become dizzy.  The effects of anesthesia and/or medications are still present and drowsiness may occur    Do not consume alcohol, tranquilizers, sleeping medications, or any other non prescribed medication for the remainder of the day.    Diet:   begin with liquids, progress your diet as tolerated or as directed by your surgeon.  Nausea and vomiting may occur in the next 24 hours.    SCOPOLAMINE PATCH INSTRUCTION SHEET    You have had a scopolamine patch placed behind your right or left ear to help prevent or alleviate nausea and vomiting after your surgery.      Keep the patch dry to prevent it from falling off.    Remove the patch in the morning the day after your surgery.     After removing the patch, be sure to wash your hands and the area behind your ear with soap and water.    To avoid accidental contact or ingestion by children or pets, fold the used patch in half with the sticky side together and dispose in a secure trash bin.    If you are still having nausea and/or vomiting the day after your surgery, contact your physician.    If you have bothersome symptoms related to the patch such as dry mouth, blurry vision, confusion, dizziness, excessive drowsiness, or local rash, you may remove the patch.  If symptoms persist or you have questions about your symptoms, contact your physician.

## 2014-11-22 NOTE — PACU (Signed)
Pt arrived from OR stating that her pain was 9/10, - observed facial grimacing, moaning, occasional tearing, and anxiety. Gave Fentanyl x4, 0.5 Dilaudid x4 per orders. Pt stated that her pain "went down a knotch" and rated her pain 8/10. I called anesthesia after the third dose of Dilaudid. Dr. Lawana Pai put in orders for additional Dilaudid and Toradol.    Continuing to provide pain management per orders. Will continue to monitor closely.    Scapolamine patch placed behind right ear.    VSS, no s/s of cardiac/ respiratory distress. No episode of vomiting. Pt stated she felt nauseated before leaving for phase II - gave zofran and monitored closely. Pt stated that her nausea subsided prior to transfer      Spouse updated, and at bedside.     Rates pain 3/10 currently.    Pt met requirements for d/c from PACU. Gave report to [place], Charity fundraiser

## 2014-11-22 NOTE — Op Note (Signed)
Procedure Date: 11/22/2014     Patient Type: A     SURGEON: Waldon Merl MD  ASSISTANT:  Theodoro Clock MD     PREOPERATIVE DIAGNOSES:  Pelvic pain, persistent right ovarian cyst.     POSTOPERATIVE DIAGNOSES:  Pain with right ovarian to pelvic sidewall adhesions in the cul-de-sac and  intestinal adhesions.     TITLE OF PROCEDURE:  Operative laparoscopy with right ovarian cystotomy, lysis of right ovarian  to the abdominal sidewall adhesion.     ANESTHESIOLOGIST:  Dr. Lawana Pai.     ANESTHESIA:  General.     FINDINGS:  Uterus normal in appearance.  The left tube and ovary were normal.  The  right tube was normal.  The right ovary had a total of 13 mL of  straw-colored fluid that was aspirated.  The other findings as discussed in  postop diagnoses.     DRAINS:  None.     ESTIMATED BLOOD LOSS:  Minimal.     DESCRIPTION OF PROCEDURE:  The patient was taken to the operating room, placed on the table in a  supine position.  Anesthesia was initiated.  She was then placed in the  lithotomy position, prepped and draped in the usual manner.  Foley catheter  was inserted. Patient was placed in lithotomy position. Speculum was placed in the vagina, single tooth tenaculum was used to grasp the anterior lip of the cervix. The cervix was dilated to admit the then placed uterine manipulator.Attention was turned to the abdomen. An infraumbilical incision was made.  The patient was placed  in Trendelenburg.  A 5 mm trocar sleeve was placed through an infraumbilical incision.  This did not penetrate into the abdominal cavity secondary to the obese abdomen. Attempt was made on the left upper quadrant, again, did  not fit through the fat pad.  We then called for the long trocar.    We were able to place this through the infraumbilical incision.Pneumoperitoneum was created using CO2.  Another  incision was made on the patient's right side, lateral to the infraumbilical  incision.  Through this the findings as noted.  Also through this  the  adhesion to the right ovary sidewall was lysed using sharp scissors.  The  cyst on the right ovary was aspirated and subsequently deflated and moved freely.    There was a large amount of intestine/colon adherent in the  cul-de-sac, we were not able to mobilize.This may be contributing  significantly to the patient's pain.  The appendix was partially viewed and  appeared to be normal.  A decision was made not to remove the  normal-appearing tubes and ovaries and the procedure was terminated.  The  laparoscope was removed followed by the pneumoperitoneum.  Once this was  adequate the sleeves were removed.  The incisions were repaired using 4-0  Monocryl interrupted stitches.  Then the incision sites were injected with  a total of 14 mL of 0.5% Marcaine solution.  The Foley catheter was removed  as was the intrauterine catheter.  The patient tolerated the procedure well  and was transferred to the recovery room in satisfactory condition.           D:  11/22/2014 13:44 PM by Dr. Waldon Merl, MD [phone])  T:  11/22/2014 15:43 PM by Noni Saupe      Everlean Cherry: [id number]) (Doc ID: [id number])

## 2014-11-22 NOTE — Op Note (Signed)
BRIEF GYN OP NOTE    Date Time: 11/22/2014 1:17 PM  Patient Name:   Gwendolyn [name]    Date of Operation:   11/22/2014    Preoperative Diagnosis:   Pre-Op Diagnosis Codes:     * Pain, female pelvic [R10.2]     * Cyst, ovary, follicular [N83.0]    Postoperative Diagnosis:   Same, right ovary-pelvic side wall adhesion, cul de sac intestinal adhesions    Providers Performing:   Surgeon(s):  Waldon Merl, MD  Theodoro Clock, MD    Assistant (s):    Operative Procedure:   Procedure(s):  LAPAROSCOPIC, OPERATIVE with right ovarian cytsotomy, lysis of right ovary-abdominal   Side wall adhesion    Findings:   As in post op diagnosis    Anesthesia:   General       Estimated Blood Loss:   minimal      Specimens:   Right ovarian cystic fluid    Complications:   none    Condition:   stable        Waldon Merl, MD  11/22/2014  1:17 PM
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
## 2014-11-23 ENCOUNTER — Encounter: Payer: Self-pay | Admitting: Obstetrics & Gynecology

## 2014-11-23 NOTE — OR PostOp (Signed)
Pt spoke with dr deacu regarding post anesthesia concerns

## 2014-11-23 NOTE — OR PostOp (Signed)
Spoke with pt regarding anesthesia concerns. Pt stated she did speak with anesthesia and was advised to contact her pulmomologist which she did and has an appoiintment on Monday. Pt also contacted Dr Ledon Snare and waiting for call back

## 2014-11-23 NOTE — OR PostOp (Signed)
Pt contacted via post op phone call, concerns regarding s/s post op with breathing and chest congestion. Myra from anesthesia department notified regarding pt concerns. Myra to try and notify Dr Lawana Pai to speak to pt. If unable to reach dr Lawana Pai, Hollie Salk will have an anesthesiologist contact pt.    Tommi Rumps Deacu aware of above

## 2014-11-28 LAB — LAB USE ONLY - HISTORICAL NON-GYN MEDICAL CYTOLOGY

## 2014-12-05 ENCOUNTER — Encounter (INDEPENDENT_AMBULATORY_CARE_PROVIDER_SITE_OTHER): Payer: Self-pay | Admitting: Internal Medicine

## 2014-12-21 ENCOUNTER — Other Ambulatory Visit: Payer: Self-pay

## 2014-12-21 ENCOUNTER — Ambulatory Visit (INDEPENDENT_AMBULATORY_CARE_PROVIDER_SITE_OTHER): Payer: HMO | Admitting: Internal Medicine

## 2014-12-23 LAB — STOOL H.PYLORI ANTIGEN: Helicobacter pylori AG, Stool: NEGATIVE

## 2015-01-30 ENCOUNTER — Ambulatory Visit (INDEPENDENT_AMBULATORY_CARE_PROVIDER_SITE_OTHER): Payer: BC Managed Care – PPO | Admitting: Internal Medicine

## 2015-01-30 ENCOUNTER — Other Ambulatory Visit: Payer: Self-pay

## 2015-01-30 ENCOUNTER — Encounter (INDEPENDENT_AMBULATORY_CARE_PROVIDER_SITE_OTHER): Payer: Self-pay | Admitting: Internal Medicine

## 2015-01-30 VITALS — BP 112/80 | HR 62 | Temp 98.0°F | Ht 64.0 in | Wt 240.0 lb

## 2015-01-30 DIAGNOSIS — Z0000 Encounter for general adult medical examination without abnormal findings: Secondary | ICD-10-CM

## 2015-01-30 DIAGNOSIS — E559 Vitamin D deficiency, unspecified: Secondary | ICD-10-CM

## 2015-01-30 DIAGNOSIS — M549 Dorsalgia, unspecified: Secondary | ICD-10-CM

## 2015-01-30 DIAGNOSIS — R9431 Abnormal electrocardiogram [ECG] [EKG]: Secondary | ICD-10-CM

## 2015-01-30 MED ORDER — MECLIZINE HCL 25 MG PO TABS
25.0000 mg | ORAL_TABLET | Freq: Three times a day (TID) | ORAL | Status: DC | PRN
Start: 2015-01-30 — End: 2015-07-12

## 2015-01-30 MED ORDER — CLONAZEPAM 1 MG PO TABS
1.0000 mg | ORAL_TABLET | ORAL | Status: DC | PRN
Start: 2015-01-30 — End: 2015-06-12

## 2015-01-30 MED ORDER — THYROID 15 MG PO TABS
15.0000 mg | ORAL_TABLET | Freq: Every day | ORAL | Status: DC
Start: 2015-01-30 — End: 2015-10-11

## 2015-01-30 MED ORDER — HYDROCODONE-IBUPROFEN 5-200 MG PO TABS
1.0000 | ORAL_TABLET | Freq: Three times a day (TID) | ORAL | Status: DC | PRN
Start: 2015-01-30 — End: 2015-03-29

## 2015-01-30 NOTE — Progress Notes (Signed)
Subjective:       Patient ID: Gwendolyn [name] is a 54 y.o. female.    HPI Comments: 54 year old in for cpe      The following portions of the patient's history were reviewed and updated as appropriate: allergies, current medications, past family history, past medical history, past social history, past surgical history and problem list.    Review of Systems   Constitutional: Negative for diaphoresis, fatigue and unexpected weight change.   HENT: Negative for congestion, dental problem, ear pain, hearing loss, rhinorrhea, sinus pressure, sneezing, sore throat and trouble swallowing.    Eyes: Negative for pain and visual disturbance.   Respiratory: Negative for apnea, cough, chest tightness and shortness of breath.    Cardiovascular: Negative for chest pain, palpitations and leg swelling.   Gastrointestinal: Negative for nausea, vomiting, abdominal pain, diarrhea, constipation and blood in stool.   Genitourinary: Negative for dysuria, urgency, frequency, hematuria, decreased urine volume, vaginal discharge, difficulty urinating, vaginal pain, pelvic pain and dyspareunia.   Musculoskeletal: Negative for myalgias and arthralgias.   Skin: Negative for color change and rash.   Neurological: Negative for dizziness, tremors, weakness and headaches.   Hematological: Negative for adenopathy.   Psychiatric/Behavioral: Negative for suicidal ideas, sleep disturbance, self-injury and agitation.           Objective:    Physical Exam   Constitutional: She is oriented to person, place, and time. She appears well-developed and well-nourished.   HENT:   Head: Normocephalic and atraumatic.   Right Ear: External ear normal.   Left Ear: External ear normal.   Nose: Nose normal.   Mouth/Throat: Oropharynx is clear and moist.   Eyes: EOM are normal. Right eye exhibits no discharge. Left eye exhibits no discharge.   Neck: Normal range of motion. Neck supple. No JVD present. No thyromegaly present.   Cardiovascular: Normal rate, regular  rhythm, normal heart sounds and intact distal pulses.    No murmur heard.  Pulmonary/Chest: Effort normal and breath sounds normal.   Abdominal: Soft. Bowel sounds are normal. She exhibits no distension and no mass. There is no rebound and no guarding.   Musculoskeletal: Normal range of motion. She exhibits no edema or tenderness.   Lymphadenopathy:     She has no cervical adenopathy.   Neurological: She is oriented to person, place, and time. She has normal reflexes.   Skin: Skin is warm and dry.   Psychiatric: She has a normal mood and affect. Her behavior is normal.   Nursing note and vitals reviewed.          Assessment:             Plan:      Procedures          1. Annual physical exam  ECG 12 lead    CBC without differential    Comprehensive metabolic panel    Lipid panel    TSH    Urinalysis    Vitamin D,25 OH, Total    Ambulatory referral to Gastroenterology    Ambulatory referral to Gynecology    Mammo Digital Screening Bilateral W Cad    Ambulatory referral to Dermatology   2. Vitamin D deficiency  Vitamin D,25 OH, Total   3. Back pain, unspecified back pain laterality, unspecified location

## 2015-01-30 NOTE — Progress Notes (Signed)
Subjective:       Patient ID: Gwendolyn [name] is a 54 y.o. female.    Back Pain  This is a new problem. The current episode started 1 to 4 weeks ago. The problem occurs constantly. The problem has been gradually worsening since onset. The pain is present in the lumbar spine. The quality of the pain is described as aching and burning. The pain radiates to the left foot. Pertinent negatives include no abdominal pain, bladder incontinence, chest pain, dysuria, paresis, paresthesias, pelvic pain, tingling, weakness or weight loss.       The following portions of the patient's history were reviewed and updated as appropriate: allergies, current medications, past family history, past medical history, past social history, past surgical history and problem list.    Review of Systems   Constitutional: Negative for weight loss.   Cardiovascular: Negative for chest pain.   Gastrointestinal: Negative for abdominal pain.   Genitourinary: Negative for bladder incontinence, dysuria and pelvic pain.   Musculoskeletal: Positive for back pain.   Neurological: Negative for tingling, weakness and paresthesias.           Objective:    Physical Exam   Constitutional: She is oriented to person, place, and time. She appears well-developed and well-nourished.   HENT:   Head: Normocephalic and atraumatic.   Right Ear: External ear normal.   Left Ear: External ear normal.   Nose: Nose normal.   Mouth/Throat: Oropharynx is clear and moist.   Eyes: EOM are normal. Right eye exhibits no discharge. Left eye exhibits no discharge.   Neck: Normal range of motion. Neck supple. No JVD present. No thyromegaly present.   Cardiovascular: Normal rate, regular rhythm, normal heart sounds and intact distal pulses.    No murmur heard.  Pulmonary/Chest: Effort normal and breath sounds normal.   Abdominal: Soft. Bowel sounds are normal. She exhibits no distension and no mass. There is no rebound and no guarding.   Musculoskeletal: Normal range of motion. She  exhibits no edema or tenderness.   Lymphadenopathy:     She has no cervical adenopathy.   Neurological: She is oriented to person, place, and time. She has normal reflexes.   Skin: Skin is warm and dry.   Psychiatric: She has a normal mood and affect. Her behavior is normal.   Nursing note and vitals reviewed.          Assessment:       54 year old in for back pain      Plan:      Procedures      goes to Dr Delton See for back   has been on norco

## 2015-01-31 ENCOUNTER — Other Ambulatory Visit (INDEPENDENT_AMBULATORY_CARE_PROVIDER_SITE_OTHER): Payer: Self-pay | Admitting: Internal Medicine

## 2015-01-31 DIAGNOSIS — E559 Vitamin D deficiency, unspecified: Secondary | ICD-10-CM

## 2015-01-31 LAB — CBC
Hematocrit: 44 % (ref 34.0–46.6)
Hemoglobin: 14.6 g/dL (ref 11.1–15.9)
MCH: 30.5 pg (ref 26.6–33.0)
MCHC: 33.2 g/dL (ref 31.5–35.7)
MCV: 92 fL (ref 79–97)
Platelets: 259 10*3/uL (ref 150–379)
RBC: 4.79 x10E6/uL (ref 3.77–5.28)
RDW: 14.2 % (ref 12.3–15.4)
WBC: 6.1 10*3/uL (ref 3.4–10.8)

## 2015-01-31 LAB — URINALYSIS
Bilirubin, UA: NEGATIVE
Blood, UA: NEGATIVE
Glucose, UA: NEGATIVE
Ketones UA: NEGATIVE
Leukocyte Esterase, UA: NEGATIVE
Nitrite, UA: NEGATIVE
Protein, UA: NEGATIVE
Urine Specific Gravity: 1.03 (ref 1.005–1.030)
Urobilinogen, Ur: 0.2 mg/dL (ref 0.2–1.0)
pH, UA: 5 (ref 5.0–7.5)

## 2015-01-31 LAB — LIPID PANEL
Cholesterol / HDL Ratio: 4.1 ratio units (ref 0.0–4.4)
Cholesterol: 243 mg/dL — ABNORMAL HIGH (ref 100–199)
HDL: 59 mg/dL (ref >39)
LDL Calculated: 155 mg/dL — ABNORMAL HIGH (ref 0–99)
Triglycerides: 147 mg/dL (ref 0–149)
VLDL Calculated: 29 mg/dL (ref 5–40)

## 2015-01-31 LAB — COMPREHENSIVE METABOLIC PANEL
ALT: 25 IU/L (ref 0–32)
AST (SGOT): 24 IU/L (ref 0–40)
Albumin/Globulin Ratio: 1.7 (ref 1.1–2.5)
Albumin: 4.4 g/dL (ref 3.5–5.5)
Alkaline Phosphatase: 118 IU/L — ABNORMAL HIGH (ref 39–117)
BUN / Creatinine Ratio: 20 (ref 9–23)
BUN: 17 mg/dL (ref 6–24)
Bilirubin, Total: 0.4 mg/dL (ref 0.0–1.2)
CO2: 25 mmol/L (ref 18–29)
Calcium: 10 mg/dL (ref 8.7–10.2)
Chloride: 102 mmol/L (ref 97–108)
Creatinine: 0.83 mg/dL (ref 0.57–1.00)
EGFR: 82 mL/min/{1.73_m2} (ref >59)
EGFR: 94 mL/min/{1.73_m2} (ref >59)
Globulin, Total: 2.6 g/dL (ref 1.5–4.5)
Glucose: 100 mg/dL — ABNORMAL HIGH (ref 65–99)
Potassium: 5.1 mmol/L (ref 3.5–5.2)
Protein, Total: 7 g/dL (ref 6.0–8.5)
Sodium: 144 mmol/L (ref 134–144)

## 2015-01-31 LAB — VITAMIN D,25 OH,TOTAL: Vitamin D 25-Hydroxy: 23.6 ng/mL — ABNORMAL LOW (ref 30.0–100.0)

## 2015-01-31 LAB — TSH: TSH: 4.08 u[IU]/mL (ref 0.450–4.500)

## 2015-01-31 MED ORDER — ERGOCALCIFEROL 1.25 MG (50000 UT) PO CAPS
50000.0000 [IU] | ORAL_CAPSULE | ORAL | Status: DC
Start: 2015-01-31 — End: 2015-02-27

## 2015-02-27 ENCOUNTER — Ambulatory Visit (INDEPENDENT_AMBULATORY_CARE_PROVIDER_SITE_OTHER): Payer: BC Managed Care – PPO | Admitting: Internal Medicine

## 2015-02-27 ENCOUNTER — Other Ambulatory Visit (FREE_STANDING_LABORATORY_FACILITY): Payer: BC Managed Care – PPO

## 2015-02-27 ENCOUNTER — Ambulatory Visit (INDEPENDENT_AMBULATORY_CARE_PROVIDER_SITE_OTHER): Payer: Self-pay | Admitting: Internal Medicine

## 2015-02-27 ENCOUNTER — Encounter (INDEPENDENT_AMBULATORY_CARE_PROVIDER_SITE_OTHER): Payer: Self-pay | Admitting: Internal Medicine

## 2015-02-27 VITALS — BP 127/91 | HR 72 | Temp 98.2°F | Ht 64.0 in | Wt 240.0 lb

## 2015-02-27 DIAGNOSIS — F5101 Primary insomnia: Secondary | ICD-10-CM

## 2015-02-27 DIAGNOSIS — F329 Major depressive disorder, single episode, unspecified: Secondary | ICD-10-CM

## 2015-02-27 DIAGNOSIS — R7309 Other abnormal glucose: Secondary | ICD-10-CM

## 2015-02-27 DIAGNOSIS — F32A Depression, unspecified: Secondary | ICD-10-CM

## 2015-02-27 DIAGNOSIS — R739 Hyperglycemia, unspecified: Secondary | ICD-10-CM

## 2015-02-27 DIAGNOSIS — R748 Abnormal levels of other serum enzymes: Secondary | ICD-10-CM

## 2015-02-27 DIAGNOSIS — E559 Vitamin D deficiency, unspecified: Secondary | ICD-10-CM

## 2015-02-27 LAB — HEMOLYSIS INDEX: Hemolysis Index: 22 — ABNORMAL HIGH (ref 0–18)

## 2015-02-27 LAB — HEMOGLOBIN A1C: Hemoglobin A1C: 5.6 % (ref 0.0–6.0)

## 2015-02-27 LAB — ALKALINE PHOSPHATASE: Alkaline Phosphatase: 147 U/L — ABNORMAL HIGH (ref 37–106)

## 2015-02-27 MED ORDER — DULOXETINE HCL 30 MG PO CPEP
30.0000 mg | ORAL_CAPSULE | Freq: Every day | ORAL | Status: DC
Start: 2015-02-27 — End: 2015-04-18

## 2015-02-27 MED ORDER — ERGOCALCIFEROL 1.25 MG (50000 UT) PO CAPS
50000.0000 [IU] | ORAL_CAPSULE | ORAL | Status: DC
Start: 2015-02-27 — End: 2016-01-29

## 2015-02-27 MED ORDER — ZOLPIDEM TARTRATE 10 MG PO TABS
10.0000 mg | ORAL_TABLET | Freq: Every evening | ORAL | Status: DC | PRN
Start: 2015-02-27 — End: 2015-03-29

## 2015-02-27 NOTE — Progress Notes (Signed)
Subjective:       Patient ID: Gwendolyn [name] is a 54 y.o. female.    Hyperglycemia  She presents for her initial diabetic visit. She has type 2 diabetes mellitus. Pertinent negatives for hypoglycemia include no confusion, dizziness, headaches, mood changes, pallor or tremors. Pertinent negatives for diabetes include no chest pain, no fatigue, no foot ulcerations, no polydipsia, no polyphagia, no polyuria, no visual change and no weakness.   Anxiety  Presents for initial visit. Symptoms include excessive worry and insomnia. Patient reports no chest pain, confusion, dizziness, nausea, palpitations, shortness of breath or suicidal ideas.           The following portions of the patient's history were reviewed and updated as appropriate: allergies, current medications, past family history, past medical history, past social history, past surgical history and problem list.    Review of Systems   Constitutional: Negative for diaphoresis, fatigue and unexpected weight change.   HENT: Negative for congestion, dental problem, ear pain, hearing loss, rhinorrhea, sinus pressure, sneezing, sore throat and trouble swallowing.    Eyes: Negative for pain and visual disturbance.   Respiratory: Negative for apnea, cough, chest tightness and shortness of breath.    Cardiovascular: Negative for chest pain, palpitations and leg swelling.   Gastrointestinal: Negative for nausea, vomiting, abdominal pain, diarrhea, constipation and blood in stool.   Endocrine: Negative for polydipsia, polyphagia and polyuria.   Genitourinary: Negative for dysuria, urgency, frequency, hematuria, decreased urine volume, vaginal discharge, difficulty urinating, vaginal pain, pelvic pain and dyspareunia.   Musculoskeletal: Negative for myalgias and arthralgias.   Skin: Negative for color change, pallor and rash.   Neurological: Negative for dizziness, tremors, weakness and headaches.   Hematological: Negative for adenopathy.   Psychiatric/Behavioral: Negative  for suicidal ideas, confusion, sleep disturbance, self-injury and agitation. The patient has insomnia.            Objective:    Physical Exam   Constitutional: She is oriented to person, place, and time. She appears well-developed and well-nourished.   HENT:   Head: Normocephalic and atraumatic.   Right Ear: External ear normal.   Left Ear: External ear normal.   Nose: Nose normal.   Mouth/Throat: Oropharynx is clear and moist.   Eyes: EOM are normal. Right eye exhibits no discharge. Left eye exhibits no discharge.   Neck: Normal range of motion. Neck supple. No JVD present. No thyromegaly present.   Cardiovascular: Normal rate, regular rhythm, normal heart sounds and intact distal pulses.    No murmur heard.  Pulmonary/Chest: Effort normal and breath sounds normal.   Abdominal: Soft. Bowel sounds are normal. She exhibits no distension and no mass. There is no rebound and no guarding.   Musculoskeletal: Normal range of motion. She exhibits no edema or tenderness.   Lymphadenopathy:     She has no cervical adenopathy.   Neurological: She is oriented to person, place, and time. She has normal reflexes.   Skin: Skin is warm and dry.   Psychiatric: She has a normal mood and affect. Her behavior is normal.   Nursing note and vitals reviewed.          Assessment:             Plan:      Procedures        1. Vitamin D deficiency  ergocalciferol (ERGOCALCIFEROL) 50000 UNITS capsule   2. Elevated blood sugar  Hemoglobin A1c   3. Depression  DULoxetine (CYMBALTA) 30 MG capsule  4. Elevated alkaline phosphatase level  Alkaline phosphatase   5. Primary insomnia  zolpidem (AMBIEN) 10 MG tablet

## 2015-03-01 ENCOUNTER — Other Ambulatory Visit (INDEPENDENT_AMBULATORY_CARE_PROVIDER_SITE_OTHER): Payer: Self-pay | Admitting: Internal Medicine

## 2015-03-01 DIAGNOSIS — R748 Abnormal levels of other serum enzymes: Secondary | ICD-10-CM

## 2015-03-06 ENCOUNTER — Ambulatory Visit (INDEPENDENT_AMBULATORY_CARE_PROVIDER_SITE_OTHER): Payer: Self-pay

## 2015-03-15 ENCOUNTER — Ambulatory Visit (INDEPENDENT_AMBULATORY_CARE_PROVIDER_SITE_OTHER): Payer: Self-pay

## 2015-03-16 ENCOUNTER — Other Ambulatory Visit (FREE_STANDING_LABORATORY_FACILITY): Payer: No Typology Code available for payment source

## 2015-03-16 DIAGNOSIS — R748 Abnormal levels of other serum enzymes: Secondary | ICD-10-CM

## 2015-03-20 LAB — ALKALINE PHOSPHATASE, ISOENZYMES
Alkaline Phosphatase: 125 U/L (ref 33–130)
Bone Isoenzyme: 27 % — ABNORMAL LOW (ref 28–66)
Intestinal Isoenzyme: 5 % (ref 1–24)
Liver Isoenzyme: 68 % (ref 25–69)
Macrohepatic Isoenzyme: 0 % (ref <=0)
Placental Isoenzyme: 0 % (ref <=0)

## 2015-03-29 ENCOUNTER — Ambulatory Visit (INDEPENDENT_AMBULATORY_CARE_PROVIDER_SITE_OTHER): Payer: Self-pay | Admitting: Internal Medicine

## 2015-03-29 ENCOUNTER — Ambulatory Visit (INDEPENDENT_AMBULATORY_CARE_PROVIDER_SITE_OTHER): Payer: No Typology Code available for payment source | Admitting: Internal Medicine

## 2015-03-29 VITALS — BP 132/71 | HR 80 | Temp 98.1°F | Ht 64.0 in | Wt 240.0 lb

## 2015-03-29 DIAGNOSIS — E039 Hypothyroidism, unspecified: Secondary | ICD-10-CM

## 2015-03-29 DIAGNOSIS — R52 Pain, unspecified: Secondary | ICD-10-CM

## 2015-03-29 MED ORDER — DICLOFENAC SODIUM ER 100 MG PO TB24
100.0000 mg | ORAL_TABLET | Freq: Every day | ORAL | Status: DC
Start: 2015-03-29 — End: 2015-06-13

## 2015-03-29 MED ORDER — THYROID 30 MG PO TABS
30.0000 mg | ORAL_TABLET | Freq: Every day | ORAL | Status: DC
Start: 2015-03-29 — End: 2016-03-12

## 2015-03-29 NOTE — Progress Notes (Signed)
Subjective:       Patient ID: Gwendolyn [name] is a 54 y.o. female.    Hypothyroidism  Presents for follow-up visit. Patient reports no anxiety, cold intolerance, constipation, diaphoresis, diarrhea, fatigue, heat intolerance, hoarse voice, menstrual problem, nail problem, palpitations, tremors or weight gain.       The following portions of the patient's history were reviewed and updated as appropriate: allergies, current medications, past family history, past medical history, past social history, past surgical history and problem list.    Review of Systems   Constitutional: Negative for weight gain, diaphoresis, fatigue and unexpected weight change.   HENT: Negative for congestion, dental problem, ear pain, hearing loss, hoarse voice, rhinorrhea, sinus pressure, sneezing, sore throat and trouble swallowing.    Eyes: Negative for pain and visual disturbance.   Respiratory: Negative for apnea, cough, chest tightness and shortness of breath.    Cardiovascular: Negative for chest pain, palpitations and leg swelling.   Gastrointestinal: Negative for nausea, vomiting, abdominal pain, diarrhea, constipation and blood in stool.   Endocrine: Negative for cold intolerance and heat intolerance.   Genitourinary: Negative for dysuria, urgency, frequency, hematuria, decreased urine volume, vaginal discharge, difficulty urinating, vaginal pain, menstrual problem, pelvic pain and dyspareunia.   Musculoskeletal: Negative for myalgias and arthralgias.   Skin: Negative for color change and rash.   Neurological: Negative for dizziness, tremors, weakness and headaches.   Hematological: Negative for adenopathy.   Psychiatric/Behavioral: Negative for suicidal ideas, sleep disturbance, self-injury and agitation. The patient is not nervous/anxious.            Objective:    Physical Exam   Constitutional: She is oriented to person, place, and time. She appears well-developed and well-nourished.   HENT:   Head: Normocephalic and atraumatic.    Right Ear: External ear normal.   Left Ear: External ear normal.   Nose: Nose normal.   Mouth/Throat: Oropharynx is clear and moist.   Eyes: EOM are normal. Right eye exhibits no discharge. Left eye exhibits no discharge.   Neck: Normal range of motion. Neck supple. No JVD present. No thyromegaly present.   Cardiovascular: Normal rate, regular rhythm, normal heart sounds and intact distal pulses.    No murmur heard.  Pulmonary/Chest: Effort normal and breath sounds normal.   Abdominal: Soft. Bowel sounds are normal. She exhibits no distension and no mass. There is no rebound and no guarding.   Musculoskeletal: Normal range of motion. She exhibits no edema or tenderness.   Lymphadenopathy:     She has no cervical adenopathy.   Neurological: She is oriented to person, place, and time. She has normal reflexes.   Skin: Skin is warm and dry.   Psychiatric: She has a normal mood and affect. Her behavior is normal.   Nursing note and vitals reviewed.          Assessment:             Plan:      Procedures    1. Acquired hypothyroidism  thyroid (ARMOUR) 30 MG tablet   2. Pain  Diclofenac Sodium CR 100 MG 24 hr tablet

## 2015-04-18 ENCOUNTER — Other Ambulatory Visit (INDEPENDENT_AMBULATORY_CARE_PROVIDER_SITE_OTHER): Payer: Self-pay | Admitting: Internal Medicine

## 2015-05-22 ENCOUNTER — Other Ambulatory Visit (INDEPENDENT_AMBULATORY_CARE_PROVIDER_SITE_OTHER): Payer: Self-pay | Admitting: Internal Medicine

## 2015-06-12 ENCOUNTER — Ambulatory Visit (INDEPENDENT_AMBULATORY_CARE_PROVIDER_SITE_OTHER): Payer: No Typology Code available for payment source | Admitting: Internal Medicine

## 2015-06-12 ENCOUNTER — Ambulatory Visit
Admission: RE | Admit: 2015-06-12 | Discharge: 2015-06-12 | Disposition: A | Discharge status: Home / Self Care | Payer: No Typology Code available for payment source | Source: Ambulatory Visit | Attending: Internal Medicine | Admitting: Internal Medicine

## 2015-06-12 VITALS — BP 127/88 | HR 89 | Temp 98.2°F | Ht 64.0 in | Wt 238.0 lb

## 2015-06-12 DIAGNOSIS — N8329 Other ovarian cysts: Secondary | ICD-10-CM | POA: Insufficient documentation

## 2015-06-12 DIAGNOSIS — R102 Pelvic and perineal pain: Secondary | ICD-10-CM

## 2015-06-12 DIAGNOSIS — N8320 Unspecified ovarian cysts: Secondary | ICD-10-CM | POA: Insufficient documentation

## 2015-06-12 DIAGNOSIS — N83209 Unspecified ovarian cyst, unspecified side: Secondary | ICD-10-CM

## 2015-06-12 DIAGNOSIS — Z0000 Encounter for general adult medical examination without abnormal findings: Secondary | ICD-10-CM

## 2015-06-12 DIAGNOSIS — E559 Vitamin D deficiency, unspecified: Secondary | ICD-10-CM

## 2015-06-12 MED ORDER — OXYCODONE-ACETAMINOPHEN 7.5-325 MG PO TABS
1.0000 | ORAL_TABLET | Freq: Three times a day (TID) | ORAL | Status: DC | PRN
Start: 2015-06-12 — End: 2015-07-12

## 2015-06-12 MED ORDER — ERGOCALCIFEROL 1.25 MG (50000 UT) PO CAPS
50000.0000 [IU] | ORAL_CAPSULE | ORAL | Status: DC
Start: 2015-06-12 — End: 2016-08-20

## 2015-06-12 MED ORDER — CLONAZEPAM 1 MG PO TABS
1.0000 mg | ORAL_TABLET | ORAL | Status: DC | PRN
Start: 2015-06-12 — End: 2016-03-25

## 2015-06-12 MED ORDER — IBUPROFEN 600 MG PO TABS
600.0000 mg | ORAL_TABLET | Freq: Two times a day (BID) | ORAL | Status: DC
Start: 2015-06-12 — End: 2015-07-12

## 2015-06-12 NOTE — Progress Notes (Signed)
Subjective:       Patient ID: Gwendolyn [name] is a 54 y.o. female.    Abdominal Pain  This is a recurrent problem. The current episode started in the past 7 days. The onset quality is gradual. The problem has been unchanged. The pain is located in the RLQ. The quality of the pain is dull. The abdominal pain does not radiate. Pertinent negatives include no arthralgias, constipation, diarrhea, dysuria, frequency, headaches, hematuria, myalgias, nausea or vomiting.   Anxiety  Presents for follow-up visit. Patient reports no chest pain, dizziness, nausea, palpitations, shortness of breath or suicidal ideas.           The following portions of the patient's history were reviewed and updated as appropriate: allergies, current medications, past family history, past medical history, past social history, past surgical history and problem list.    Review of Systems   Constitutional: Negative for diaphoresis, fatigue and unexpected weight change.   HENT: Negative for congestion, dental problem, ear pain, hearing loss, rhinorrhea, sinus pressure, sneezing, sore throat and trouble swallowing.    Eyes: Negative for pain and visual disturbance.   Respiratory: Negative for apnea, cough, chest tightness and shortness of breath.    Cardiovascular: Negative for chest pain, palpitations and leg swelling.   Gastrointestinal: Negative for nausea, vomiting, abdominal pain, diarrhea, constipation and blood in stool.   Genitourinary: Negative for dysuria, urgency, frequency, hematuria, decreased urine volume, vaginal discharge, difficulty urinating, vaginal pain, pelvic pain and dyspareunia.   Musculoskeletal: Negative for myalgias and arthralgias.   Skin: Negative for color change and rash.   Neurological: Negative for dizziness, tremors, weakness and headaches.   Hematological: Negative for adenopathy.   Psychiatric/Behavioral: Negative for suicidal ideas, sleep disturbance, self-injury and agitation.           Objective:    Physical Exam    Constitutional: She is oriented to person, place, and time. She appears well-developed and well-nourished.   HENT:   Head: Normocephalic and atraumatic.   Right Ear: External ear normal.   Left Ear: External ear normal.   Nose: Nose normal.   Mouth/Throat: Oropharynx is clear and moist.   Eyes: EOM are normal. Right eye exhibits no discharge. Left eye exhibits no discharge.   Neck: Normal range of motion. Neck supple. No JVD present. No thyromegaly present.   Cardiovascular: Normal rate, regular rhythm, normal heart sounds and intact distal pulses.    No murmur heard.  Pulmonary/Chest: Effort normal and breath sounds normal.   Abdominal: Soft. Bowel sounds are normal. She exhibits no distension and no mass. There is no rebound and no guarding.   Musculoskeletal: Normal range of motion. She exhibits no edema or tenderness.   Lymphadenopathy:     She has no cervical adenopathy.   Neurological: She is oriented to person, place, and time. She has normal reflexes.   Skin: Skin is warm and dry.   Psychiatric: She has a normal mood and affect. Her behavior is normal.   Nursing note and vitals reviewed.          Assessment:             Plan:      Procedures            1. Cyst of ovary, unspecified laterality  US Pelvis Complete    ibuprofen (ADVIL,MOTRIN) 600 MG tablet    oxyCODONE-acetaminophen (PERCOCET) 7.5-325 MG per tablet    US Pelvis Complete   2. Pelvic pain  US Pelvis  Complete    ibuprofen (ADVIL,MOTRIN) 600 MG tablet    oxyCODONE-acetaminophen (PERCOCET) 7.5-325 MG per tablet    US Pelvis Complete   3. Annual physical exam  clonazePAM (KLONOPIN) 1 MG tablet   4. Vitamin D deficiency  ergocalciferol (ERGOCALCIFEROL) 50000 UNITS capsule       Has hx of cyst- lap in 12/15-check [place] refer to OB

## 2015-06-13 ENCOUNTER — Encounter (HOSPITAL_BASED_OUTPATIENT_CLINIC_OR_DEPARTMENT_OTHER): Payer: Self-pay | Admitting: Obstetrics & Gynecology

## 2015-06-13 ENCOUNTER — Ambulatory Visit (FREE_STANDING_LABORATORY_FACILITY): Payer: No Typology Code available for payment source | Admitting: Obstetrics & Gynecology

## 2015-06-13 VITALS — BP 113/75 | HR 66 | Ht 64.0 in | Wt 244.0 lb

## 2015-06-13 DIAGNOSIS — R102 Pelvic and perineal pain: Secondary | ICD-10-CM

## 2015-06-13 DIAGNOSIS — K5900 Constipation, unspecified: Secondary | ICD-10-CM

## 2015-06-13 DIAGNOSIS — Z113 Encounter for screening for infections with a predominantly sexual mode of transmission: Secondary | ICD-10-CM

## 2015-06-13 NOTE — Patient Instructions (Addendum)
Pelvic Adhesions    The adhesions that form in the abdomen following abdominal or pelvic surgery are a normal response to injury of the peritoneal surfaces during surgery, and although adhesions have some beneficial effects, they also cause significant morbidity, including adhesive small bowel obstruction, female infertility, chronic abdominal pain, and increased difficulty with subsequent surgery.    What are abdominal adhesions?  Abdominal adhesions are bands of _fibrous tissue that can form between abdominal tissues and organs. Normally, internal tissues and organs have slippery surfaces, preventing them from sticking together as the body moves. However, abdominal adhesions cause tissues and organs in the abdominal cavity to stick together.    Drawing of the gastrointestinal tract showing the esophagus, stomach, and large intestine. Inset shows abdominal adhesions on the small intestine.  Abdominal adhesions are bands of fibrous tissue that can form between abdominal tissues and organs.      What is the abdominal cavity?  The abdominal cavity is the internal area of the body between the chest and hips that contains the lower part of the esophagus, stomach, small intestine, and large intestine. The esophagus carries food and liquids from the mouth to the stomach, which slowly pumps them into the small and large intestines. Abdominal adhesions can kink, twist, or pull the small and large intestines out of place, causing an intestinal obstruction. Intestinal obstruction, also called a bowel obstruction, results in the partial or complete blockage of movement of food or stool through the intestines.        What causes abdominal adhesions?  Abdominal surgery is the most frequent cause of abdominal adhesions. Surgery-related causes include    cuts involving internal organs  handling of internal organs  drying out of internal organs and tissues  contact of internal tissues with foreign materials, such as gauze, surgical  gloves, and stitches  blood or blood clots that were not rinsed away during surgery  Abdominal adhesions can also result from inflammation not related to surgery, including    appendix rupture  radiation treatment  gynecological infections  abdominal infections  Rarely, abdominal adhesions form without apparent cause.      How common are abdominal adhesions and who is at risk?  Of patients who undergo abdominal surgery, 93 percent develop abdominal adhesions.1 Surgery in the lower abdomen and pelvis, including bowel and gynecological operations, carries an even greater chance of abdominal adhesions. Abdominal adhesions can become larger and tighter as time passes, sometimes causing problems years after surgery.    1Ward BC, Panitch A. Abdominal adhesions: current and novel therapies. Journal of Surgical Research. 2011;165(1):91-111.      What are the symptoms of abdominal adhesions?  In most cases, abdominal adhesions do not cause symptoms. When symptoms are present, chronic abdominal pain is the most common.      What are the complications of abdominal adhesions?  Abdominal adhesions can cause intestinal obstruction and female infertility--the inability to become pregnant after a year of trying.    Abdominal adhesions can lead to female infertility by preventing fertilized eggs from reaching the uterus, where fetal development takes place. Women with abdominal adhesions in or around their fallopian tubes have an increased chance of ectopic pregnancy--a fertilized egg growing outside the uterus. Abdominal adhesions inside the uterus may result in repeated miscarriages--a pregnancy failure before 20 weeks.    Seek Help for Emergency Symptoms  A complete intestinal obstruction is life threatening and requires immediate medical attention and often surgery. Symptoms of an intestinal obstruction include  severe abdominal pain or cramping  nausea  vomiting  bloating  loud bowel sounds  abdominal swelling  the inability  to have a bowel movement or pass gas  constipation--a condition in which a person has fewer than three bowel movements a week; the bowel movements may be painful  A person with these symptoms should seek medical attention immediately.      How are abdominal adhesions and intestinal obstructions diagnosed?  Abdominal adhesions cannot be detected by tests or seen through imaging techniques such as x rays or ultrasound. Most abdominal adhesions are found during surgery performed to examine the abdomen. However, abdominal x rays, a lower gastrointestinal (GI) series, and computerized tomography (CT) scans can diagnose intestinal obstructions.    Abdominal x rays use a small amount of radiation to create an image that is recorded on _film or a computer. An x ray is performed at a hospital or an outpatient center by an x-ray technician, and the images are interpreted by a radiologist--a doctor who specializes in medical imaging. An x ray does not require anesthesia. The person will lie on a table or stand during the x ray. The x-ray machine is positioned over the abdominal area. The person will hold his or her breath as the picture is taken so that the picture will not be blurry. The person may be asked to change position for additional pictures.  A lower GI series is an x-ray exam that is used to look at the large intestine. The test is performed at a hospital or an outpatient center by an x-ray technician, and the images are interpreted by a radiologist. Anesthesia is not needed. The health care provider may provide written bowel prep instructions to follow at home before the test. The person may be asked to follow a clear liquid diet for 1 to 3 days before the procedure. A laxative or an enema may be used before the test. A laxative is medication that loosens stool and increases bowel movements. An enema involves fl_ushing water or laxative into the rectum using a special squirt bottle.  For the test, the person will  lie on a table while the radiologist inserts a flexible tube into the person's anus. The large intestine is fi_lled with barium, making signs of underlying problems show up more clearly on x rays.    CT scans use a combination of x rays and computer technology to create images. The procedure is performed at a hospital or an outpatient center by an x-ray technician, and the images are interpreted by a radiologist. Anesthesia is not needed. A CT scan may include the injection of a special dye, called contrast medium. The person will lie on a table that slides into a tunnel-shaped device where the x rays are taken.      How are abdominal adhesions and intestinal obstructions treated?  Abdominal adhesions that do not cause symptoms generally do not require treatment. Surgery is the only way to treat abdominal adhesions that cause pain, intestinal obstruction, or fertility problems. More surgery, however, carries the risk of additional abdominal adhesions. People should speak with their health care provider about the best way to treat their abdominal adhesions.    Complete intestinal obstructions usually require immediate surgery to clear the blockage. Most partial intestinal obstructions can be managed without surgery.      How can abdominal adhesions be prevented?  Abdominal adhesions are diffi_cult to prevent; however, certain surgical techniques can minimize abdominal adhesions.    Laparoscopic surgery  decreases the potential for abdominal adhesions because several tiny incisions are made in the lower abdomen instead of one large incision. The surgeon inserts a laparoscope--a thin tube with a tiny video camera attached--into one of the small incisions. The camera sends a magnified image from inside the body to a video monitor. Patients will usually receive general anesthesia during this surgery.    If laparoscopic surgery is not possible and a large abdominal incision is required, at the end of surgery a special  fi_lmlike material can be inserted between organs or between the organs and the abdominal incision. The fi_lmlike material, which looks similar to wax paper and is absorbed by the body in about a week, hydrates organs to help prevent abdominal adhesions.    Other steps taken during surgery to reduce abdominal adhesions include    using starch- and latex-free gloves  handling tissues and organs gently  shortening surgery time  using moistened drapes and swabs  occasionally applying saline solution      Eating, Diet, and Nutrition  Researchers have not found that eating, diet, and nutrition play a role in causing or preventing abdominal adhesions. A person with a partial intestinal obstruction may relieve symptoms with a liquid or low-_fiber diet, which is more easily broken down into smaller particles by the digestive system.      Points to Remember  Abdominal adhesions are bands of fibrous tissue that can form between abdominal tissues and organs. Abdominal adhesions cause tissues and organs in the abdominal cavity to stick together.  Abdominal surgery is the most frequent cause of abdominal adhesions. Of patients who undergo abdominal surgery, 93 percent develop abdominal adhesions.  In most cases, abdominal adhesions do not cause symptoms. When symptoms are present, chronic abdominal pain is the most common.  A complete intestinal obstruction is life threatening and requires immediate medical attention and often surgery.  Abdominal adhesions cannot be detected by tests or seen through imaging techniques such as x rays or ultrasound. However, abdominal x rays, a lower gastrointestinal (GI) series, and computerized tomography (CT) scans can diagnose intestinal obstructions.  Surgery is the only way to treat abdominal adhesions that cause pain, intestinal obstruction, or fertility problems.      Clinical Trials  The [employer]ney Diseases (NIDDK) and other components of the  [employer] (NIH) conduct and support research into many diseases and conditions.    What are clinical trials, and are they right for you?  Clinical trials are part of clinical research and at the heart of all medical advances. Clinical trials look at new ways to prevent, detect, or treat disease. Researchers also use clinical trials to look at other aspects of care, such as improving the quality of life for people with chronic illnesses. Find out if clinical trials are right for youExternal NIH Link.    What clinical trials are open?  Clinical trials that are currently open and are recruiting can be viewed at www.ClinicalTrials.govExternal [employer].    This information may contain content about medications and, when taken as prescribed, the conditions they treat. When prepared, this content included the most current information available. For updates or for questions about any medications, contact the U.S. Food and Drug Administration toll-free at 1-888-INFO-FDA 928-319-4075) or visit http://small.net/ Link Disclaimer. Consult your health care provider for more information.      This content is provided as a service of the [employer] Digestive and Kidney  Diseases (NIDDK), part of the [employer]. The NIDDK translates and disseminates research findings through its clearinghouses and education programs to increase knowledge and understanding about health and disease among patients, health professionals, and the public. Content produced by the NIDDK is carefully reviewed by NIDDK scientists and other experts.        Constipation: After Your Visit  Your Care Instructions  Constipation means that you have a hard time passing stools (bowel movements). People pass stools from 3 times a day to once every 3 days. What is normal for you may be different. Constipation may occur with pain in the rectum and cramping. The pain may get worse when you try to  pass stools. Sometimes there are small amounts of bright red blood on toilet paper or the surface of stools because of enlarged veins near the rectum (hemorrhoids).    A few changes in your diet and lifestyle may help you avoid ongoing constipation. See the stool chart below the goal is type 3 or type 4 stool, soft but formed stools.       How can you care for yourself at home?  Drink plenty of fluids, enough so that your urine is light yellow or clear like water. If you have kidney, heart, or liver disease and have to limit fluids, talk with your doctor before you increase the amount of fluids you drink.  Include high-fiber foods, such as fruits, vegetables, beans, and whole grains, in your diet each day.  Get at least 30 minutes of exercise on most days of the week, preferably first thing in the morning. Walking is a good choice. You also may want to do other activities, such as running, swimming, cycling, or playing tennis or team sports.  Take a fiber supplement, such as Citrucel, Metamucil, or fiber gummies every day. Start with a small dose and slowly increase the dose over a month or more.  Take a stool softener. Colace is over the counter and can be taken 1-3 times per day. It is safe to take in pregnancy. Colace does not make you go to the bathroom but helps prevent hard stools  Schedule time each day for a bowel movement. A daily routine may help. Take your time having your bowel movement.  Support your feet with a small step stool when you sit on the toilet. This helps flex your hips and places your pelvis in a squatting position.  You may occasionally need an over-the-counter laxative to relieve your constipation. Examples are Senna, Milk of Magnesia and MiraLax, these are safe in pregnancy. Read and follow all instructions on the label, and do not use laxatives on a long-term basis.    When should you call for help?  You have new or worsening pain.  You have new or worsening nausea or vomiting.  You  have blood in your stools.  Your constipation does not improve with these methods  You do not get better as expected

## 2015-06-13 NOTE — Progress Notes (Signed)
S:    Gwendolyn [name] is a 54 y.o. female, G0P0000, No LMP recorded. Patient is not currently having periods (Reason: Menopausal). Here today for follow up for pelvic pain. Had ultrasound with 2 cm right ovarian cyst. History of ovarian cysts s/p diagnostic laparosocpy x 2 with ovarian cystectomy. She reports chronic dyspareunia and worsening right sided pain.  Pt denies any abn pain or abn vaginal bleeding, discharge/itching. Pt is sexually active with her husband.     GYN hx: post-menopausal, denies h/o STDs, no h/o abn pap smears    OB hx: G0P0000,    PMH:  Past Medical History   Diagnosis Date   . Disorder of thyroid    . Anxiety    . Depression    . Back pain    . SVT (supraventricular tachycardia)    . Herpes    . Ovarian cyst    . Health care maintenance very nice patient      has had EKG changes- Stress test done 6 years ago normal Dr Fayrene Helper husband is my patient  Colo done 2015 Pap 2015  Mammogram 2015   . Post-operative nausea and vomiting      APFEL 3   . Hypothyroidism    . Calculus of kidney    . Low back pain    . Arrhythmia      Occasional SVT. (Happens rarely)    . Procreative management              PSH:  Past Surgical History   Procedure Laterality Date   . Reduction mammaplasty     . Foot surgery     . Tonsillectomy     . Colonoscopy     . Laparoscopic, operative N/A 11/22/2014     Procedure: LAPAROSCOPIC OVARIAN CYSTOTOMY;  Surgeon: Waldon Merl, MD;  Location: [facility];  Service: Gynecology;  Laterality: N/A;   . Laparoscopic, lysis, adhesions N/A 11/22/2014     Procedure: LAPAROSCOPIC, LYSIS, ADHESIONS;  Surgeon: Waldon Merl, MD;  Location: [facility];  Service: Gynecology;  Laterality: N/A;         SH:  History     Social History   . Marital Status: Married     Spouse Name: N/A   . Number of Children: N/A   . Years of Education: N/A     Occupational History   . Not on file.     Social History Main Topics   . Smoking status: Never Smoker    . Smokeless tobacco: Never Used   .  Alcohol Use: 0.6 oz/week     1 Standard drinks or equivalent per week      Comment: rare   . Drug Use: No   . Sexual Activity:     Partners: Male     Birth Control/ Protection: None     Other Topics Concern   . Not on file     Social History Narrative         Allergies   Allergen Reactions   . Beef-Derived Products Anaphylaxis     And Pork products   . Erythromycin Hives   . Other Swelling and Respiratory Distress     Bee sting    . Penicillin V Hives   . Penicillins Hives         Current Outpatient Prescriptions   Medication Sig Dispense Refill   . acetaZOLAMIDE (DIAMOX) 125 MG tablet Take 125 mg by mouth 3 (three) times daily.  Takes as needed. Last dose about 3 months ago. Takes for dizziness.       . clonazePAM (KLONOPIN) 1 MG tablet Take 1 tablet (1 mg total) by mouth as needed for Anxiety. 30 tablet 0   . DULoxetine (CYMBALTA) 30 MG capsule TAKE 1 CAPSULE(30 MG) BY MOUTH DAILY 30 capsule 0   . ergocalciferol (ERGOCALCIFEROL) 50000 UNITS capsule Take 1 capsule (50,000 Units total) by mouth once a week. 8 capsule 1   . ibuprofen (ADVIL,MOTRIN) 600 MG tablet Take 1 tablet (600 mg total) by mouth 2 (two) times daily. 20 tablet 0   . meclizine (ANTIVERT) 25 MG tablet Take 1 tablet (25 mg total) by mouth 3 (three) times daily as needed. 30 tablet 2   . oxyCODONE-acetaminophen (PERCOCET) 7.5-325 MG per tablet Take 1 tablet by mouth every 8 (eight) hours as needed for Pain. 30 tablet 0   . thyroid (ARMOUR) 30 MG tablet Take 1 tablet (30 mg total) by mouth daily. 90 tablet 3   . tiZANidine (ZANAFLEX) 4 MG tablet Take 4 mg by mouth as needed.     1   . valacyclovir (VALTREX) 1000 MG tablet As needed for cold sores    6   . verapamil (CALAN) 40 MG tablet Take 40 mg by mouth as needed. As needed for SVT       . VOLTAREN 1 % Gel topical gel   0   . Diclofenac Sodium CR 100 MG 24 hr tablet Take 100 mg by mouth daily. 90 tablet 3     No current facility-administered medications for this visit.         FH:  Family History    Problem Relation Age of Onset   . Hyperlipidemia Mother    . Hyperlipidemia Father    . Skin cancer Sister    . Breast cancer Sister 47   . Diabetes Sister    . Hyperlipidemia Sister    . Heart attack Brother    . Hyperlipidemia Brother    . Breast cancer Maternal Aunt 54   . Cervical cancer Maternal Aunt    . Lung cancer Maternal Uncle    . Brain cancer Paternal Uncle    . Diabetes Maternal Grandmother    . Diabetes Paternal Grandfather    . Colon cancer Neg Hx    . Ovarian cancer Neg Hx    . Cancer Cousin            Review of Systems -  General ROS: negative for fatigue, fever/chills, weight loss  Breast ROS: negative for breast lumps, nipple discharge  Gastrointestinal ROS: no abdominal pain, change in bowel habits  Genitourinary ROS: no dysuria, trouble voiding, or hematuria  Skin: denies rash or lesion  Psych: denies depression  All other systems reviewed- negative     O:    BP 113/75 mmHg  Pulse 66  Ht 5\' 4"  (1.626 m)  Wt 244 lb (110.678 kg)  BMI 41.86 kg/m2    General appearance - alert, well appearing, and in no distress  Abdomen - soft, nontender, nondistended, no masses, obese  Extremities: no signs of clubbing or edema     Pelvic exam:  VULVA: normal appearing vulva with no masses, tenderness or lesions, normal clitoris  VAGINA: normal appearing vagina with normal color and discharge, no visible lesions, area of scar at the patient's right apex tender on palpation of this area.   CERVIX: normal appearing cervix without discharge or lesions,  UTERUS tender  on the right side no masses.  RECTAL: normal external, + external hemorrhioids          A:  1. Pelvic pain    2. Screen for STD (sexually transmitted disease)    3. Constipation, unspecified constipation type              Plan    - Discussed her pelvic pain and my suspicion of adhesions likely from the apex of the vagina. C/w exam and patient's pain with dyspareunia with deep penetration. Given the size of the cyst 2 cm it is unlikely the cause of  her pain but given her history of recurrent cysts and a post-menopausal cysts will do trial of Lupron. If pain does not improve with Lupron will discuss diagnotic laparoscopy for possible lysis of adhesions.   - Pt also has chronic constipation, reviewed bowel regimen recommended senna prn.   -STI testing offered and GC/CT ordered today.  - All questions answered and AVS reviewed with the patient.    - Follow up in 6-8 weeks, 1 year for well woman exam or PRN for issues      Orders Placed This Encounter   Procedures   . C. Trachomatis/N. Gonorrhoeae RNA, TMA       Ronalee Red MD, MPH

## 2015-06-15 ENCOUNTER — Encounter (HOSPITAL_BASED_OUTPATIENT_CLINIC_OR_DEPARTMENT_OTHER): Payer: Self-pay | Admitting: Obstetrics & Gynecology

## 2015-06-15 LAB — C. TRACHOMATIS/N. GONORRHOEAE RNA, TMA
Chlamydia trachomatis RNA, TMA: NOT DETECTED
N. gonorrhoeae RNA, TMA: NOT DETECTED

## 2015-06-21 ENCOUNTER — Encounter (INDEPENDENT_AMBULATORY_CARE_PROVIDER_SITE_OTHER): Payer: Self-pay | Admitting: Obstetrics & Gynecology

## 2015-06-26 ENCOUNTER — Encounter (HOSPITAL_BASED_OUTPATIENT_CLINIC_OR_DEPARTMENT_OTHER): Payer: Self-pay | Admitting: Obstetrics & Gynecology

## 2015-06-26 ENCOUNTER — Ambulatory Visit (INDEPENDENT_AMBULATORY_CARE_PROVIDER_SITE_OTHER): Payer: No Typology Code available for payment source | Admitting: Obstetrics & Gynecology

## 2015-06-26 VITALS — BP 128/83 | HR 91 | Ht 64.0 in | Wt 244.4 lb

## 2015-06-26 DIAGNOSIS — R102 Pelvic and perineal pain: Secondary | ICD-10-CM

## 2015-06-26 MED ORDER — HYDROCODONE-ACETAMINOPHEN 5-300 MG PO TABS
1.0000 | ORAL_TABLET | ORAL | Status: DC | PRN
Start: 2015-06-26 — End: 2015-07-13

## 2015-06-26 NOTE — Progress Notes (Signed)
HPI:     Gwendolyn [name] is a 54 y.o. female, G0P0000, No LMP recorded. Patient is not currently having periods (Reason: Menopausal). Here today for follow up for pelvic pain. Patient reports she continues to have daily pain and is taking ibuprofen and Vicodin. 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 From Dr. Dewitt Hoes op note " There was a large amount of intestine/colon adherent in the  cul-de-sac, we were not able to mobilize.This may be contributing  significantly to the patient's pain."      GYN hx: s/p menopause,  no h/o STDs, no h/o abn pap smears    OB hx: G0P0000,    PMH:     Past Medical History   Diagnosis Date   . Disorder of thyroid    . Anxiety    . Depression    . Back pain    . SVT (supraventricular tachycardia)    . Herpes    . Ovarian cyst    . Health care maintenance very nice patient      has had EKG changes- Stress test done 6 years ago normal Dr Fayrene Helper husband is my patient  Colo done 2015 Pap 2015  Mammogram 2015   . Post-operative nausea and vomiting      APFEL 3   . Hypothyroidism    . Calculus of kidney    . Low back pain    . Arrhythmia      Occasional SVT. (Happens rarely)    . Procreative management          PSH:     Past Surgical History   Procedure Laterality Date   . Reduction mammaplasty     . Foot surgery     . Tonsillectomy     . Colonoscopy     . Laparoscopic, operative N/A 11/22/2014     Procedure: LAPAROSCOPIC OVARIAN CYSTOTOMY;  Surgeon: Waldon Merl, MD;  Location: [facility];  Service: Gynecology;  Laterality: N/A;   . Laparoscopic, lysis, adhesions N/A 11/22/2014     Procedure: LAPAROSCOPIC, LYSIS, ADHESIONS;  Surgeon: Waldon Merl, MD;  Location: [facility];  Service: Gynecology;  Laterality: N/A;         Family History   Problem Relation Age of Onset   . Hyperlipidemia  Mother    . Hyperlipidemia Father    . Skin cancer Sister    . Breast cancer Sister 59   . Diabetes Sister    . Hyperlipidemia Sister    . Heart attack Brother    . Hyperlipidemia Brother    . Breast cancer Maternal Aunt 54   . Cervical cancer Maternal Aunt    . Lung cancer Maternal Uncle    . Brain cancer Paternal Uncle    . Diabetes Maternal Grandmother    . Diabetes Paternal Grandfather    . Colon cancer Neg Hx    . Ovarian cancer Neg Hx    . Cancer Cousin          SH:  Social History     Social History   . Marital Status: Married     Spouse Name: N/A   . Number of Children: N/A   . Years of Education: N/A     Occupational History   . Not on file.  Social History Main Topics   . Smoking status: Never Smoker    . Smokeless tobacco: Never Used   . Alcohol Use: 0.6 oz/week     1 Standard drinks or equivalent per week      Comment: rare   . Drug Use: No   . Sexual Activity:     Partners: Male     Birth Control/ Protection: None     Other Topics Concern   . Not on file     Social History Narrative         Allergies   Allergen Reactions   . Beef-Derived Products Anaphylaxis     And Pork products   . Erythromycin Hives   . Other Swelling and Respiratory Distress     Bee sting    . Penicillin V Hives   . Penicillins Hives           Current outpatient prescriptions:   .  acetaZOLAMIDE (DIAMOX) 125 MG tablet, Take 125 mg by mouth 3 (three) times daily. Takes as needed. Last dose about 3 months ago. Takes for dizziness. , Disp: , Rfl:   .  clonazePAM (KLONOPIN) 1 MG tablet, Take 1 tablet (1 mg total) by mouth as needed for Anxiety., Disp: 30 tablet, Rfl: 0  .  DULoxetine (CYMBALTA) 30 MG capsule, TAKE 1 CAPSULE(30 MG) BY MOUTH DAILY, Disp: 30 capsule, Rfl: 0  .  ergocalciferol (ERGOCALCIFEROL) 50000 UNITS capsule, Take 1 capsule (50,000 Units total) by mouth once a week., Disp: 8 capsule, Rfl: 1  .  ibuprofen (ADVIL,MOTRIN) 600 MG tablet, Take 1 tablet (600 mg total) by mouth 2 (two) times daily., Disp: 20 tablet, Rfl:  0  .  meclizine (ANTIVERT) 25 MG tablet, Take 1 tablet (25 mg total) by mouth 3 (three) times daily as needed., Disp: 30 tablet, Rfl: 2  .  oxyCODONE-acetaminophen (PERCOCET) 7.5-325 MG per tablet, Take 1 tablet by mouth every 8 (eight) hours as needed for Pain., Disp: 30 tablet, Rfl: 0  .  thyroid (ARMOUR) 30 MG tablet, Take 1 tablet (30 mg total) by mouth daily., Disp: 90 tablet, Rfl: 3  .  tiZANidine (ZANAFLEX) 4 MG tablet, Take 4 mg by mouth as needed.  , Disp: , Rfl: 1  .  valacyclovir (VALTREX) 1000 MG tablet, As needed for cold sores , Disp: , Rfl: 6  .  verapamil (CALAN) 40 MG tablet, Take 40 mg by mouth as needed. As needed for SVT , Disp: , Rfl:   .  VOLTAREN 1 % Gel topical gel, , Disp: , Rfl: 0  .  Hydrocodone-Acetaminophen (VICODIN) 5-300 MG Tab, Take 1 tablet by mouth every 4 (four) hours as needed., Disp: 20 tablet, Rfl: 0        Review of Systems -  General ROS: negative for fatigue, fever/chills, weight loss  Breast ROS: negative for breast lumps, nipple discharge  Gastrointestinal ROS: no abdominal pain, change in bowel habits  Genitourinary ROS: no dysuria, trouble voiding, or hematuria  Skin: denies rash or lesion  Psych: denies depression  All other systems reviewed- negative      O:    BP 128/83 mmHg  Pulse 91  Ht 5\' 4"  (1.626 m)  Wt 244 lb 6.4 oz (110.859 kg)  BMI 41.93 kg/m2    General appearance - alert, well appearing, and in no distress  Exam deferred        A:  1. Pain, female pelvic  Plan  - Referral to general surgery for evaluation of pelvic pain and adhesive disease  - continue bowel regimen to help with constipation   - refill for vicodin provided 20 tabs, no refills   - follow up in 4 weeks after general surgery consultation  - All questions answered AVS reviewed with the patient.      Ronalee Red MD, MPH

## 2015-06-26 NOTE — Patient Instructions (Signed)
Abdominal Pain, Adhesions fromSurgery  Surgery on the abdomen can cause bands of fibrous scar tissue (also known as adhesions) to form. Adhesions can form bands around the intestine and cause a partial or complete blockage of the intestinal tract ("intestinal obstruction"). A blocked intestine requires surgery.  Symptoms  Abdominal adhesions can cause the following symptoms:   Severe abdominal pain, acute or chronic   Nausea and vomiting   Bloating   Inability to pass gas or strool  Adhesions are more common in people who have had multiple abdominal surgeries. Diagnosis is made using blood tests, X-ray, CT scan, rectal exam and (in women) pelvic exam. Abdominal adhesions are permanent. They can be treated by a surgical procedure to remove the scar tissue. However, this treatment may create more scar tissue, and the problem may reoccur.  Home care   Rest as needed, until feeling better.   Eat a diet low in fiber (called a low-residue diet). Foods allowed include refined breads, white rice, fruit and vegetable juices without pulp, tender meats. These foods will pass more easily through the intestine.   Avoid whole-grain foods, whole fruits and vegetables, tough meats, seeds and nuts until your symptoms go away.  Follow-up care  Follow up with your health care provideras advised.  If X-rays were done, they will be read by a radiologist and you will be notified if there are any changes.  Call 911  Call emergency services if any of the following occur:   Trouble breathing   Confusion   Very drowsy or trouble awakening   Fainting or loss of consciousness   Rapid heart rate  When to seek medical advice  Call your health care provider right awayif any of these occur:   Pain gets worse or moves to the right lower abdomen   New or worsening vomiting or diarrhea   Swelling of the abdomen   Unable to pass stool for more than three days   New fever over 100.0 F (37.8 C), or rising fever   Blood in  vomit or bowel movements (dark red or black color)   Weakness, dizziness or fainting   Chest, arm, back, neck or jaw pain   (In women): Unexpected vaginal bleeding or missed period   2000-2015 [employer] [address], [place] [address]. All rights reserved. This information is not intended as a substitute for professional medical care. Always follow your healthcare professional's instructions.

## 2015-06-27 ENCOUNTER — Other Ambulatory Visit (INDEPENDENT_AMBULATORY_CARE_PROVIDER_SITE_OTHER): Payer: Self-pay | Admitting: Internal Medicine

## 2015-06-27 MED ORDER — DULOXETINE HCL 30 MG PO CPEP
30.0000 mg | ORAL_CAPSULE | Freq: Every day | ORAL | Status: DC
Start: 2015-06-27 — End: 2015-07-29

## 2015-06-30 ENCOUNTER — Other Ambulatory Visit: Payer: Self-pay | Admitting: Surgery

## 2015-06-30 DIAGNOSIS — R1031 Right lower quadrant pain: Secondary | ICD-10-CM

## 2015-06-30 DIAGNOSIS — N83209 Unspecified ovarian cyst, unspecified side: Secondary | ICD-10-CM

## 2015-07-06 ENCOUNTER — Ambulatory Visit: Admission: RE | Admit: 2015-07-06 | Payer: No Typology Code available for payment source | Source: Ambulatory Visit

## 2015-07-07 ENCOUNTER — Encounter (INDEPENDENT_AMBULATORY_CARE_PROVIDER_SITE_OTHER): Payer: Self-pay | Admitting: Internal Medicine

## 2015-07-10 ENCOUNTER — Ambulatory Visit
Admission: RE | Admit: 2015-07-10 | Discharge: 2015-07-10 | Disposition: A | Discharge status: Home / Self Care | Payer: No Typology Code available for payment source | Source: Ambulatory Visit | Attending: Surgery | Admitting: Surgery

## 2015-07-10 DIAGNOSIS — N8329 Other ovarian cysts: Secondary | ICD-10-CM | POA: Insufficient documentation

## 2015-07-10 DIAGNOSIS — R1031 Right lower quadrant pain: Secondary | ICD-10-CM | POA: Insufficient documentation

## 2015-07-10 DIAGNOSIS — N83209 Unspecified ovarian cyst, unspecified side: Secondary | ICD-10-CM

## 2015-07-10 MED ORDER — IOHEXOL 240 MG/ML IJ SOLN
50.0000 mL | Freq: Once | INTRAMUSCULAR | Status: AC | PRN
Start: 2015-07-10 — End: 2015-07-10
  Administered 2015-07-10: 50 mL via ORAL

## 2015-07-10 MED ORDER — IOHEXOL 350 MG/ML IV SOLN
100.0000 mL | Freq: Once | INTRAVENOUS | Status: AC | PRN
Start: 2015-07-10 — End: 2015-07-10
  Administered 2015-07-10: 100 mL via INTRAVENOUS

## 2015-07-11 ENCOUNTER — Ambulatory Visit
Admission: RE | Admit: 2015-07-11 | Discharge: 2015-07-11 | Disposition: A | Discharge status: Home / Self Care | Payer: No Typology Code available for payment source | Source: Ambulatory Visit | Attending: Surgery | Admitting: Surgery

## 2015-07-11 ENCOUNTER — Encounter (INDEPENDENT_AMBULATORY_CARE_PROVIDER_SITE_OTHER): Payer: Self-pay | Admitting: Obstetrics & Gynecology

## 2015-07-11 DIAGNOSIS — R1031 Right lower quadrant pain: Secondary | ICD-10-CM | POA: Insufficient documentation

## 2015-07-11 LAB — COMPREHENSIVE METABOLIC PANEL
ALT: 29 U/L (ref 0–55)
AST (SGOT): 22 U/L (ref 5–34)
Albumin/Globulin Ratio: 1.4 (ref 0.9–2.2)
Albumin: 4.1 g/dL (ref 3.5–5.0)
Alkaline Phosphatase: 136 U/L — ABNORMAL HIGH (ref 37–106)
BUN: 18 mg/dL (ref 7.0–19.0)
Bilirubin, Total: 0.4 mg/dL (ref 0.1–1.2)
CO2: 25 mEq/L (ref 21–30)
Calcium: 9.8 mg/dL (ref 8.5–10.5)
Chloride: 106 mEq/L (ref 100–111)
Creatinine: 1 mg/dL (ref 0.4–1.5)
Globulin: 3 g/dL (ref 2.0–3.7)
Glucose: 102 mg/dL — ABNORMAL HIGH (ref 70–100)
Potassium: 4.4 mEq/L (ref 3.5–5.3)
Protein, Total: 7.1 g/dL (ref 6.0–8.3)
Sodium: 142 mEq/L (ref 135–146)

## 2015-07-11 LAB — CBC AND DIFFERENTIAL
Basophils Absolute Automated: 0.04 10*3/uL (ref 0.00–0.20)
Basophils Automated: 1 %
Eosinophils Absolute Automated: 0.18 10*3/uL (ref 0.00–0.70)
Eosinophils Automated: 2 %
Hematocrit: 44.9 % (ref 37.0–47.0)
Hgb: 14.5 g/dL (ref 12.0–16.0)
Immature Granulocytes Absolute: 0.02 10*3/uL
Immature Granulocytes: 0 %
Lymphocytes Absolute Automated: 1.4 10*3/uL (ref 0.50–4.40)
Lymphocytes Automated: 19 %
MCH: 29.7 pg (ref 28.0–32.0)
MCHC: 32.3 g/dL (ref 32.0–36.0)
MCV: 91.8 fL (ref 80.0–100.0)
MPV: 11.4 fL (ref 9.4–12.3)
Monocytes Absolute Automated: 0.66 10*3/uL (ref 0.00–1.20)
Monocytes: 9 %
Neutrophils Absolute: 4.97 10*3/uL (ref 1.80–8.10)
Neutrophils: 68 %
Nucleated RBC: 0 /100 WBC (ref 0–1)
Platelets: 285 10*3/uL (ref 140–400)
RBC: 4.89 10*6/uL (ref 4.20–5.40)
RDW: 14 % (ref 12–15)
WBC: 7.27 10*3/uL (ref 3.50–10.80)

## 2015-07-11 LAB — GFR: EGFR: 58.2

## 2015-07-11 LAB — HEMOLYSIS INDEX: Hemolysis Index: 2 (ref 0–18)

## 2015-07-12 ENCOUNTER — Encounter (HOSPITAL_BASED_OUTPATIENT_CLINIC_OR_DEPARTMENT_OTHER): Payer: Self-pay | Admitting: Obstetrics & Gynecology

## 2015-07-12 NOTE — Pre-Procedure Instructions (Signed)
Pt h/o PONV, Scop patch rx faxed to her pharmacy for placement behind ear on morning of surgery at home. Instructions reviewed w/ pt during phone interview and she verbalized understanding.

## 2015-07-13 ENCOUNTER — Encounter
Admission: RE | Disposition: A | Discharge status: Home / Self Care | Payer: Self-pay | Source: Ambulatory Visit | Attending: Surgery

## 2015-07-13 ENCOUNTER — Ambulatory Visit: Payer: Self-pay

## 2015-07-13 ENCOUNTER — Ambulatory Visit: Payer: No Typology Code available for payment source | Admitting: Surgery

## 2015-07-13 ENCOUNTER — Ambulatory Visit
Admission: RE | Admit: 2015-07-13 | Discharge: 2015-07-13 | Disposition: A | Discharge status: Home / Self Care | Payer: No Typology Code available for payment source | Source: Ambulatory Visit | Attending: Surgery | Admitting: Surgery

## 2015-07-13 ENCOUNTER — Ambulatory Visit: Payer: No Typology Code available for payment source

## 2015-07-13 DIAGNOSIS — B009 Herpesviral infection, unspecified: Secondary | ICD-10-CM | POA: Insufficient documentation

## 2015-07-13 DIAGNOSIS — K660 Peritoneal adhesions (postprocedural) (postinfection): Secondary | ICD-10-CM | POA: Insufficient documentation

## 2015-07-13 DIAGNOSIS — G8929 Other chronic pain: Secondary | ICD-10-CM | POA: Insufficient documentation

## 2015-07-13 DIAGNOSIS — E039 Hypothyroidism, unspecified: Secondary | ICD-10-CM | POA: Insufficient documentation

## 2015-07-13 DIAGNOSIS — Z880 Allergy status to penicillin: Secondary | ICD-10-CM | POA: Insufficient documentation

## 2015-07-13 DIAGNOSIS — N8320 Unspecified ovarian cysts: Secondary | ICD-10-CM | POA: Insufficient documentation

## 2015-07-13 DIAGNOSIS — R1031 Right lower quadrant pain: Secondary | ICD-10-CM | POA: Insufficient documentation

## 2015-07-13 DIAGNOSIS — Z883 Allergy status to other anti-infective agents status: Secondary | ICD-10-CM | POA: Insufficient documentation

## 2015-07-13 DIAGNOSIS — N8329 Other ovarian cysts: Secondary | ICD-10-CM

## 2015-07-13 DIAGNOSIS — N7011 Chronic salpingitis: Secondary | ICD-10-CM | POA: Insufficient documentation

## 2015-07-13 DIAGNOSIS — I471 Supraventricular tachycardia: Secondary | ICD-10-CM | POA: Insufficient documentation

## 2015-07-13 DIAGNOSIS — N83201 Unspecified ovarian cyst, right side: Secondary | ICD-10-CM

## 2015-07-13 DIAGNOSIS — R102 Pelvic and perineal pain: Secondary | ICD-10-CM | POA: Insufficient documentation

## 2015-07-13 DIAGNOSIS — Z7901 Long term (current) use of anticoagulants: Secondary | ICD-10-CM

## 2015-07-13 SURGERY — LAPAROSCOPY, DIAGNOSTIC
Anesthesia: Anesthesia General | Site: Abdomen | Wound class: Clean

## 2015-07-13 MED ORDER — GLYCOPYRROLATE 0.2 MG/ML IJ SOLN
INTRAMUSCULAR | Status: DC | PRN
Start: 2015-07-13 — End: 2015-07-13
  Administered 2015-07-13: .6 mg via INTRAVENOUS

## 2015-07-13 MED ORDER — HYDROMORPHONE HCL 2 MG PO TABS
2.0000 mg | ORAL_TABLET | Freq: Once | ORAL | Status: AC | PRN
Start: 2015-07-13 — End: 2015-07-13

## 2015-07-13 MED ORDER — LIDOCAINE HCL (PF) 1 % IJ SOLN
INTRAMUSCULAR | Status: DC | PRN
Start: 2015-07-13 — End: 2015-07-13
  Administered 2015-07-13: 80 mg via INTRAVENOUS

## 2015-07-13 MED ORDER — METRONIDAZOLE IN NACL 500 MG/100 ML IV SOLN
INTRAVENOUS | Status: AC
Start: 2015-07-13 — End: ?
  Filled 2015-07-13: qty 100

## 2015-07-13 MED ORDER — FENTANYL CITRATE (PF) 50 MCG/ML IJ SOLN (WRAP)
25.0000 ug | INTRAMUSCULAR | Status: DC | PRN
Start: 2015-07-13 — End: 2015-07-13

## 2015-07-13 MED ORDER — PROPOFOL 10 MG/ML IV EMUL (WRAP)
INTRAVENOUS | Status: DC | PRN
Start: 2015-07-13 — End: 2015-07-13
  Administered 2015-07-13: 200 mg via INTRAVENOUS

## 2015-07-13 MED ORDER — SCOPOLAMINE 1 MG/3DAYS TD PT72
MEDICATED_PATCH | TRANSDERMAL | Status: AC
Start: 2015-07-13 — End: ?
  Filled 2015-07-13: qty 1

## 2015-07-13 MED ORDER — FENTANYL CITRATE (PF) 50 MCG/ML IJ SOLN (WRAP)
INTRAMUSCULAR | Status: AC
Start: 2015-07-13 — End: 2015-07-13
  Administered 2015-07-13: 25 ug via INTRAVENOUS
  Filled 2015-07-13: qty 2

## 2015-07-13 MED ORDER — ONDANSETRON HCL 4 MG/2ML IJ SOLN
INTRAMUSCULAR | Status: DC | PRN
Start: 2015-07-13 — End: 2015-07-13
  Administered 2015-07-13: 4 mg via INTRAVENOUS

## 2015-07-13 MED ORDER — OXYCODONE-ACETAMINOPHEN 5-325 MG PO TABS
1.0000 | ORAL_TABLET | Freq: Once | ORAL | Status: DC | PRN
Start: 2015-07-13 — End: 2015-07-13

## 2015-07-13 MED ORDER — ONDANSETRON HCL 4 MG/2ML IJ SOLN
INTRAMUSCULAR | Status: AC
Start: 2015-07-13 — End: 2015-07-13
  Administered 2015-07-13: 4 mg via INTRAVENOUS
  Filled 2015-07-13: qty 2

## 2015-07-13 MED ORDER — LEVOFLOXACIN IN D5W 500 MG/100ML IV SOLN
INTRAVENOUS | Status: AC
Start: 2015-07-13 — End: ?
  Filled 2015-07-13: qty 100

## 2015-07-13 MED ORDER — ROCURONIUM BROMIDE 50 MG/5ML IV SOLN
INTRAVENOUS | Status: AC
Start: 2015-07-13 — End: ?
  Filled 2015-07-13: qty 5

## 2015-07-13 MED ORDER — LEVOFLOXACIN IN D5W 500 MG/100ML IV SOLN
500.0000 mg | INTRAVENOUS | Status: AC
Start: 2015-07-13 — End: 2015-07-13
  Administered 2015-07-13: 500 mg via INTRAVENOUS

## 2015-07-13 MED ORDER — GLYCOPYRROLATE 0.2 MG/ML IJ SOLN
INTRAMUSCULAR | Status: AC
Start: 2015-07-13 — End: ?
  Filled 2015-07-13: qty 3

## 2015-07-13 MED ORDER — ENOXAPARIN SODIUM 40 MG/0.4ML SC SOLN
SUBCUTANEOUS | Status: AC
Start: 2015-07-13 — End: ?
  Filled 2015-07-13: qty 0.4

## 2015-07-13 MED ORDER — HYDROMORPHONE HCL 1 MG/ML IJ SOLN
0.5000 mg | INTRAMUSCULAR | Status: AC | PRN
Start: 2015-07-13 — End: 2015-07-13
  Administered 2015-07-13 (×3): 0.5 mg via INTRAVENOUS

## 2015-07-13 MED ORDER — HYDROMORPHONE HCL 2 MG PO TABS
ORAL_TABLET | ORAL | Status: AC
Start: 2015-07-13 — End: 2015-07-13
  Administered 2015-07-13: 2 mg via ORAL
  Filled 2015-07-13: qty 1

## 2015-07-13 MED ORDER — METRONIDAZOLE IN NACL 500 MG/100 ML IV SOLN
500.0000 mg | INTRAVENOUS | Status: DC
Start: 2015-07-13 — End: 2015-07-13
  Administered 2015-07-13: 500 mg via INTRAVENOUS

## 2015-07-13 MED ORDER — PROMETHAZINE HCL 25 MG/ML IJ SOLN
INTRAMUSCULAR | Status: AC
Start: 2015-07-13 — End: 2015-07-13
  Administered 2015-07-13: 6.25 mg via INTRAVENOUS
  Filled 2015-07-13: qty 1

## 2015-07-13 MED ORDER — BUPIVACAINE-EPINEPHRINE (PF) 0.25% -1:200000 IJ SOLN
INTRAMUSCULAR | Status: AC
Start: 2015-07-13 — End: ?
  Filled 2015-07-13: qty 30

## 2015-07-13 MED ORDER — ENOXAPARIN SODIUM 40 MG/0.4ML SC SOLN
40.0000 mg | SUBCUTANEOUS | Status: AC
Start: 2015-07-13 — End: 2015-07-13
  Administered 2015-07-13: 40 mg via SUBCUTANEOUS

## 2015-07-13 MED ORDER — FENTANYL CITRATE (PF) 50 MCG/ML IJ SOLN (WRAP)
25.0000 ug | INTRAMUSCULAR | Status: AC | PRN
Start: 2015-07-13 — End: 2015-07-13
  Administered 2015-07-13 (×3): 25 ug via INTRAVENOUS

## 2015-07-13 MED ORDER — OXYCODONE-ACETAMINOPHEN 7.5-325 MG PO TABS
1.0000 | ORAL_TABLET | Freq: Four times a day (QID) | ORAL | Status: DC | PRN
Start: 2015-07-13 — End: 2016-04-11

## 2015-07-13 MED ORDER — PROMETHAZINE HCL 25 MG/ML IJ SOLN
6.2500 mg | Freq: Once | INTRAMUSCULAR | Status: DC | PRN
Start: 2015-07-13 — End: 2015-07-13

## 2015-07-13 MED ORDER — BUPIVACAINE-EPINEPHRINE (PF) 0.25% -1:200000 IJ SOLN
INTRAMUSCULAR | Status: DC | PRN
Start: 2015-07-13 — End: 2015-07-13
  Administered 2015-07-13: 30 mL via INTRAMUSCULAR

## 2015-07-13 MED ORDER — HYDROMORPHONE HCL 1 MG/ML IJ SOLN
INTRAMUSCULAR | Status: AC
Start: 2015-07-13 — End: 2015-07-13
  Administered 2015-07-13: 0.5 mg via INTRAVENOUS
  Filled 2015-07-13: qty 2

## 2015-07-13 MED ORDER — SUCCINYLCHOLINE CHLORIDE 20 MG/ML IJ SOLN
INTRAMUSCULAR | Status: DC | PRN
Start: 2015-07-13 — End: 2015-07-13
  Administered 2015-07-13: 140 mg via INTRAVENOUS

## 2015-07-13 MED ORDER — DEXAMETHASONE SOD PHOSPHATE PF 10 MG/ML IJ SOLN
INTRAMUSCULAR | Status: AC
Start: 2015-07-13 — End: ?
  Filled 2015-07-13: qty 1

## 2015-07-13 MED ORDER — LACTATED RINGERS IV SOLN
100.0000 mL/h | INTRAVENOUS | Status: DC
Start: 2015-07-13 — End: 2015-07-13

## 2015-07-13 MED ORDER — SUCCINYLCHOLINE CHLORIDE 20 MG/ML IJ SOLN
INTRAMUSCULAR | Status: AC
Start: 2015-07-13 — End: ?
  Filled 2015-07-13: qty 10

## 2015-07-13 MED ORDER — HEPARIN SODIUM (PORCINE) 5000 UNIT/ML IJ SOLN
INTRAMUSCULAR | Status: AC
Start: 2015-07-13 — End: ?
  Filled 2015-07-13: qty 1

## 2015-07-13 MED ORDER — FENTANYL CITRATE (PF) 50 MCG/ML IJ SOLN (WRAP)
INTRAMUSCULAR | Status: AC
Start: 2015-07-13 — End: ?
  Filled 2015-07-13: qty 5

## 2015-07-13 MED ORDER — PROMETHAZINE HCL 25 MG/ML IJ SOLN
6.2500 mg | Freq: Once | INTRAMUSCULAR | Status: AC | PRN
Start: 2015-07-13 — End: 2015-07-13

## 2015-07-13 MED ORDER — HYDROMORPHONE HCL 1 MG/ML IJ SOLN
0.5000 mg | INTRAMUSCULAR | Status: DC | PRN
Start: 2015-07-13 — End: 2015-07-13

## 2015-07-13 MED ORDER — LIDOCAINE HCL (PF) 2 % IJ SOLN
INTRAMUSCULAR | Status: AC
Start: 2015-07-13 — End: ?
  Filled 2015-07-13: qty 5

## 2015-07-13 MED ORDER — ONDANSETRON HCL 4 MG/2ML IJ SOLN
4.0000 mg | Freq: Once | INTRAMUSCULAR | Status: DC | PRN
Start: 2015-07-13 — End: 2015-07-13

## 2015-07-13 MED ORDER — ONDANSETRON HCL 4 MG/2ML IJ SOLN
4.0000 mg | Freq: Once | INTRAMUSCULAR | Status: AC | PRN
Start: 2015-07-13 — End: 2015-07-13

## 2015-07-13 MED ORDER — NEOSTIGMINE METHYLSULFATE 1 MG/ML IJ SOLN
INTRAMUSCULAR | Status: DC | PRN
Start: 2015-07-13 — End: 2015-07-13
  Administered 2015-07-13: 4 mg via INTRAVENOUS

## 2015-07-13 MED ORDER — ROCURONIUM BROMIDE 50 MG/5ML IV SOLN
INTRAVENOUS | Status: DC | PRN
Start: 2015-07-13 — End: 2015-07-13
  Administered 2015-07-13: 40 mg via INTRAVENOUS
  Administered 2015-07-13: 10 mg via INTRAVENOUS

## 2015-07-13 MED ORDER — SCOPOLAMINE 1 MG/3DAYS TD PT72
1.0000 | MEDICATED_PATCH | Freq: Once | TRANSDERMAL | Status: DC
Start: 2015-07-13 — End: 2015-07-13
  Administered 2015-07-13: 1 via TRANSDERMAL

## 2015-07-13 MED ORDER — LACTATED RINGERS IV SOLN
INTRAVENOUS | Status: DC | PRN
Start: 2015-07-13 — End: 2015-07-13

## 2015-07-13 MED ORDER — MIDAZOLAM HCL 2 MG/2ML IJ SOLN
INTRAMUSCULAR | Status: DC | PRN
Start: 2015-07-13 — End: 2015-07-13
  Administered 2015-07-13: 2 mg via INTRAVENOUS

## 2015-07-13 MED ORDER — DEXAMETHASONE SODIUM PHOSPHATE 4 MG/ML IJ SOLN (WRAP)
INTRAMUSCULAR | Status: DC | PRN
Start: 2015-07-13 — End: 2015-07-13
  Administered 2015-07-13: 5 mg via INTRAVENOUS

## 2015-07-13 MED ORDER — FENTANYL CITRATE (PF) 50 MCG/ML IJ SOLN (WRAP)
INTRAMUSCULAR | Status: DC | PRN
Start: 2015-07-13 — End: 2015-07-13
  Administered 2015-07-13 (×2): 100 ug via INTRAVENOUS
  Administered 2015-07-13: 50 ug via INTRAVENOUS

## 2015-07-13 MED ORDER — MIDAZOLAM HCL 2 MG/2ML IJ SOLN
INTRAMUSCULAR | Status: AC
Start: 2015-07-13 — End: ?
  Filled 2015-07-13: qty 2

## 2015-07-13 MED ORDER — ONDANSETRON HCL 4 MG/2ML IJ SOLN
INTRAMUSCULAR | Status: AC
Start: 2015-07-13 — End: ?
  Filled 2015-07-13: qty 2

## 2015-07-13 SURGICAL SUPPLY — 80 items
ADHESIVE SKIN CLOSURE DERMABOND ADVANCED (Skin Closure) ×1
ADHESIVE SKIN CLOSURE DERMABOND ADVANCED .7 ML LIQUID APPLICATOR (Skin Closure) IMPLANT
ADHESIVE SKIN CLOSURE DERMABOND MINI .36 (Suture) ×1
ADHESIVE SKIN CLOSURE DERMABOND MINI .36 ML LIQUID APPLICATOR (Suture) ×1 IMPLANT
ADHESIVE SKIN SURGISEAL .35ML (Suture) ×2 IMPLANT
ADHESIVE SKNCLS 2 OCTYL CYNCRLT .36ML MN (Suture) ×1
ADHESIVE SKNCLS 2 OCTYL CYNCRLT .7ML (Skin Closure) ×1
APPLCATOR CHLORAPREP 26ML (Prep) ×2 IMPLANT
APPLIER IN CLP TI MED LG LIGAMAX 5MM (Endoscopic Supplies) ×1
APPLIER INTERNAL CLIP MEDIUM LARGE (Endoscopic Supplies) ×1
APPLIER INTERNAL CLIP MEDIUM LARGE TITANIUM ANGLE JAW DISTAL TIP CLOSE (Endoscopic Supplies) ×1 IMPLANT
CABLE HIGH FREQUENCY MONOPOLAR (Cautery) IMPLANT
CANULA STABILITY 5MM (Procedure Accessories) ×4 IMPLANT
CATHETER FOLEY KT 16FR (Catheter Micellaneous)
CATHETER FOLEY KT 16FR (Catheter Miscellaneous) IMPLANT
CLIP ENDO 10 MM (Laparoscopy Supplies) IMPLANT
GLOVE SURG BIOGEL INDIC SZ 6.5 (Glove) ×2 IMPLANT
GLOVE SURG BIOGEL PF LTX SZ7.0 (Glove) ×2 IMPLANT
GLOVE SURG BIOGEL SZ6.5 (Glove) ×6 IMPLANT
GOWN OPTIMA STRL BACK OR (Gown) ×2 IMPLANT
INACTIVE USE LAWSON 120900 (Gown) ×2 IMPLANT
INSTR DISSECT BLUNT (Instrument) ×2 IMPLANT
LIGATOR ESCP 12 E-LP VCL 18IN ABS SUT (Suture)
LIGATOR L18 IN ENDOLOOP VICRYL (Suture)
LIGATOR L18 IN ENDOLOOP VICRYL ABSORBABLE SUTURE BRAID ENDOSCOPIC 12 (Suture) ×2 IMPLANT
MANIPULATOR ESCP KRNR MNPJTR 5MM 33CM LF (Laparoscopy Supplies) ×1
MANIPULATOR UTERINE OD5 MM ENDOSCOPIC (Laparoscopy Supplies) ×1
MANIPULATOR UTERINE OD5 MM ENDOSCOPIC KRONNER MANIPUJECTOR CATHETER (Laparoscopy Supplies) ×1 IMPLANT
PACK GEN LAPAROSCOPY FFX (Pack) ×2 IMPLANT
PAD ELECTROSRG GRND REM W CRD (Procedure Accessories) ×2 IMPLANT
PAD PREP CUFF 24X41IN W 9IN (Prep) ×2 IMPLANT
POUCH SPEC RTRVL PU E-CTCH GLD 10MM 34.5 (Laparoscopy Supplies) ×1
POUCH SPECIMEN RETRIEVAL L34.5 CM (Laparoscopy Supplies) ×1
POUCH SPECIMEN RETRIEVAL L34.5 CM ERGONOMIC HANDLE LONG CYLINDRICAL (Laparoscopy Supplies) ×1 IMPLANT
RELOAD STAPLER 2 MM 2.5 MM 3 MM L45 MM (Staplers)
RELOAD STAPLER 2 MM 2.5 MM 3 MM L45 MM ENDO GIA TITANIUM MEDIUM (Staplers) IMPLANT
RELOAD STAPLER 2 MM 2.5 MM 3 MM L60 MM (Staplers) ×1
RELOAD STAPLER 2 MM 2.5 MM 3 MM L60 MM ENDO GIA TITANIUM MEDIUM (Staplers) ×1 IMPLANT
RELOAD STAPLER 3 MM 3.5 MM 4 MM L45 MM (Staplers)
RELOAD STAPLER 3 MM 3.5 MM 4 MM L45 MM ENDO GIA TITANIUM MEDIUM THICK (Staplers) IMPLANT
RELOAD STPLR TI 2MM 2.5MM 3MM EGIA 45MM (Staplers)
RELOAD STPLR TI 2MM 2.5MM 3MM EGIA 60MM (Staplers) ×1
RELOAD STPLR TI 3MM 3.5MM 4MM EGIA 45MM (Staplers)
SCISSOR ENDOCUT DISP LAPO (Instrument) ×2 IMPLANT
SHEAR CURVD ERGO HNDLE 36CM (Cautery) IMPLANT
SHEAR SCALP HARM ACE (Cautery) ×2 IMPLANT
SOL IRR 0.9% NACL 500ML PLS PR BTL ISTNC (Irrigation Solutions) ×1
SOL NACL 0.9% IRR 3000CC (Irrigation Solutions) ×1
SOLUTION IRR 0.9% NACL 3L URMTC LF PLS (Irrigation Solutions) ×1
SOLUTION IRRIGATION 0.9% SDM CHLORIDE 500ML PR BTTL ISOTONIC NONPRGNC (Irrigation Solutions) ×1 IMPLANT
SOLUTION IRRIGATION 0.9% SODIUM CHLORIDE (Irrigation Solutions) ×1
SOLUTION IRRIGATION 0.9% SODIUM CHLORIDE 3000 ML PLASTIC CONTAINER (Irrigation Solutions) ×1 IMPLANT
SOLUTION IV 0.9% NACL 1000ML VFLX LF PLS (IV Solutions) ×1
SOLUTION IV 0.9% SODIUM CHLORIDE PVC (IV Solutions) ×1
SOLUTION IV 0.9% SODIUM CHLORIDE PVC 1000 ML PH 5 PLASTIC CONTAINER (IV Solutions) ×1 IMPLANT
SPONGE CHLRPRP TINT 26ML (Applicator) ×3 IMPLANT
STAPLER INTNL PVC STD UNV EGIA 12MM (Staplers) ×1
STAPLER TISSUE L16 CM X W4 MM OD12 MM (Staplers) ×1
STAPLER TISSUE L16 CM X W4 MM OD12 MM ENDOSCOPIC ENDO GIA PVC INTERNAL (Staplers) ×1 IMPLANT
SUCTION IRRIGATOR 2 WITH TIP (Suction) IMPLANT
SUTURE ABS 0 VCL STPK 18IN BRD TIE 6 (Suture)
SUTURE COATED VICRYL 0 L18 IN BRAID TIES (Suture)
SUTURE COATED VICRYL 0 L18 IN BRAID TIES 6 STRAND COATED VIOLET (Suture) ×1 IMPLANT
SUTURE MONOCRYL 4-0 PS2 27IN (Suture) ×3 IMPLANT
SUTURE VICRYL 0 UR6 27IN (Suture) ×4 IMPLANT
SYRINGE 50 ML GRADUATE NONPYROGENIC DEHP (Syringes, Needles) ×1
SYRINGE 50 ML GRADUATE NONPYROGENIC DEHP FREE PVC FREE BD MEDICAL (Syringes, Needles) ×1 IMPLANT
SYRINGE MED 50ML LF STRL GRAD N-PYRG (Syringes, Needles) ×1
SYSTEM IMAGING 8X6IN CLEARIFY MICROFIBER WARM HUB TRCR WIPE DSPSBL (Kits) ×1 IMPLANT
SYSTEM IMG MRFBR CLEARIFY 8X6IN WRM HUB (Kits) ×2
TRAY FOLEY CATHETER 16F (Catheter Micellaneous)
TRAY FOLEY CATHETER 16F (Catheter Miscellaneous) IMPLANT
TRAY LAPAROSCOPY GYN (Pack) ×2 IMPLANT
TROCAR BLADELESS ENDO 5X100MM (Laparoscopy Supplies) ×4 IMPLANT
TROCAR ENDO BLADELESS 11X100MM (Laparoscopy Supplies) ×2 IMPLANT
TROCAR ENDOCLOSURE DEVICE (Laparoscopy Supplies) ×2 IMPLANT
TROCAR ENDOPATH XCEL BLUNT TIP (Laparoscopy Supplies) ×2 IMPLANT
TROCAR XCEL ENDO TIP 12MM (Laparoscopy Supplies) ×2 IMPLANT
TUBING INSUFFLATION LUER CONNECTOR FILTER HIGH FLOW CLEANFLOW SU1101 (Respiratory Supplies) ×2 IMPLANT
TUBING INSUFFLATION W/CO2 GUAR (Respiratory Supplies) ×4

## 2015-07-13 NOTE — PACU (Signed)
Pt awake, VSS, pain moderate and nausea subsided; pt and spouse understands pain management, received instructions, op sites to abdomen CDI, IV removed and discharged home in stable condition. To car with assistance.

## 2015-07-13 NOTE — Anesthesia Postprocedure Evaluation (Signed)
Anesthesia Post Evaluation    Patient: Gwendolyn [name]    Procedures performed: Procedure(s):  LAPAROSCOPY, DIAGNOSTIC  LAPAROSCOPIC, APPENDECTOMY  LAPAROSCOPIC, LYSIS OF ADHESIONS  LAPAROSCOPIC  SALPINGO - OOPHORECTOMY    Anesthesia type: General ETT    Patient location:Phase II PACU    Last vitals:   Filed Vitals:    07/13/15 1704   BP: 128/72   Pulse: 66   Temp:    Resp: 23   SpO2: 97%       Post pain: Patient not complaining of pain, continue current therapy      Mental Status:awake and alert     Respiratory Function: tolerating room air    Cardiovascular: stable    Nausea/Vomiting: patient not complaining of nausea or vomiting    Hydration Status: adequate    Post assessment: no apparent anesthetic complications, no reportable events and no evidence of recall

## 2015-07-13 NOTE — H&P (Signed)
Obgyn Attending H+P     ADMISSION HISTORY AND PHYSICAL EXAM    Date Time: 07/13/2015 2:07 PM  Patient Name: Gwendolyn [name]  Attending Physician: Delena Serve, MD    Assessment:   54 yo F present for diagnostic laparoscopy and right salpingo-oophorectomy for chronic pelvic pain, history of recurrent ovarian cysts and adhesive disease.     Plan:   Stable for OR after discussion of the risks, benefits, common complications and alternatives including risk of infection, bleeding, damage to surrounding organs including bladder and bowel, risk of continued pain, risk of recurrent scar tissue.   Lovenox 40 mg Subcutaneous prior to OR as pt has allergy to beef derived products    History of Present Illness:   Gwendolyn [name] is a 54 y.o. female who presents to the hospital with a history of chronic pelvic pain and recurrent ovarian cysts. She presents for diagnostic laparoscopy and right salpingo-oophorectomy.     Past Medical History:     Past Medical History   Diagnosis Date   . Disorder of thyroid    . Anxiety    . Depression    . Back pain    . SVT (supraventricular tachycardia)    . Herpes    . Ovarian cyst    . Health care maintenance very nice patient      has had EKG changes- Stress test done 6 years ago normal Dr Fayrene Helper husband is my patient  Colo done 2015 Pap 2015  Mammogram 2015   . Hypothyroidism    . Calculus of kidney    . Low back pain    . Arrhythmia      Occasional SVT. (Happens rarely)    . Procreative management    . Post-operative nausea and vomiting      APFEL 3, after last laparoscopy woke up w/ SEVERE pain       Past Surgical History:     Past Surgical History   Procedure Laterality Date   . Reduction mammaplasty     . Foot surgery     . Tonsillectomy     . Colonoscopy     . Laparoscopic, operative N/A 11/22/2014     Procedure: LAPAROSCOPIC OVARIAN CYSTOTOMY;  Surgeon: Waldon Merl, MD;  Location: [facility];  Service: Gynecology;  Laterality: N/A;   . Laparoscopic, lysis, adhesions N/A  11/22/2014     Procedure: LAPAROSCOPIC, LYSIS, ADHESIONS;  Surgeon: Waldon Merl, MD;  Location: [facility];  Service: Gynecology;  Laterality: N/A;       Family History:     Family History   Problem Relation Age of Onset   . Hyperlipidemia Mother    . Hyperlipidemia Father    . Skin cancer Sister    . Breast cancer Sister 53   . Diabetes Sister    . Hyperlipidemia Sister    . Heart attack Brother    . Hyperlipidemia Brother    . Breast cancer Maternal Aunt 54   . Cervical cancer Maternal Aunt    . Lung cancer Maternal Uncle    . Brain cancer Paternal Uncle    . Diabetes Maternal Grandmother    . Diabetes Paternal Grandfather    . Colon cancer Neg Hx    . Ovarian cancer Neg Hx    . Cancer Cousin        Social History:     Social History     Social History   . Marital Status: Married  Spouse Name: N/A   . Number of Children: N/A   . Years of Education: N/A     Social History Main Topics   . Smoking status: Never Smoker    . Smokeless tobacco: Never Used   . Alcohol Use: 0.6 oz/week     1 Standard drinks or equivalent per week      Comment: rare   . Drug Use: No   . Sexual Activity:     Partners: Male     Birth Control/ Protection: None     Other Topics Concern   . Not on file     Social History Narrative       Allergies:     Allergies   Allergen Reactions   . Beef-Derived Products Anaphylaxis     And Pork products   . Erythromycin Hives   . Other Swelling and Respiratory Distress     Bee sting    . Penicillin V Hives   . Penicillins Hives       Medications:     Prescriptions prior to admission   Medication Sig   . acetaZOLAMIDE (DIAMOX) 125 MG tablet Take 125 mg by mouth 3 (three) times daily. Takes as needed. Last dose about 3 months ago. Takes for dizziness.     Mack Guise THYROID 15 MG tablet TK 1 T PO  [name]   . DULoxetine (CYMBALTA) 30 MG capsule Take 1 capsule (30 mg total) by mouth daily. (Patient taking differently: Take 30 mg by mouth every evening.   )   . ergocalciferol (ERGOCALCIFEROL) 50000 UNITS  capsule Take 1 capsule (50,000 Units total) by mouth once a week.   Marland Kitchen HYDROcodone-acetaminophen (NORCO) 5-325 MG per tablet TK 1 T PO  Q 8 H PRF BREAKTHROUGH PAIN   . Hydrocodone-Acetaminophen (VICODIN) 5-300 MG Tab Take 1 tablet by mouth every 4 (four) hours as needed.   . naproxen sodium (ANAPROX) 220 MG tablet Take 220 mg by mouth as needed.   . thyroid (ARMOUR) 30 MG tablet Take 1 tablet (30 mg total) by mouth daily.   Marland Kitchen tiZANidine (ZANAFLEX) 4 MG tablet Take 4 mg by mouth as needed.      . verapamil (CALAN) 40 MG tablet Take 40 mg by mouth as needed. As needed for SVT     . BOOSTRIX 5-2.5-18.5 injection ADM 0.5ML IM UTD   . clonazePAM (KLONOPIN) 1 MG tablet Take 1 tablet (1 mg total) by mouth as needed for Anxiety. (Patient taking differently: Take 0.125 mg by mouth as needed for Anxiety.   )   . diazepam (VALIUM) 5 MG tablet    . TRANSDERM-SCOP, 1.5 MG, 1 MG/3DAYS    . valacyclovir (VALTREX) 1000 MG tablet As needed for cold sores     . VOLTAREN 1 % Gel topical gel        Review of Systems:   A comprehensive review of systems was: Negative except for pelvic pain, low back pain    Physical Exam:   nad  abd soft, mildly tender lower quadrants, obese   Pelvic: deferred to OR     Filed Vitals:    07/13/15 1354   BP: 119/66   Pulse: 67   Temp: 98.2 F (36.8 C)   Resp: 18   SpO2: 95%       Intake and Output Summary (Last 24 hours) at Date Time  No intake or output data in the 24 hours ending 07/13/15 1407  Labs:     Results     ** No results found for the last 24 hours. **              Signed by: Ronalee Red

## 2015-07-13 NOTE — Op Note (Signed)
FULL OPERATIVE NOTE    Date Time: 07/13/2015 3:36 PM  Patient Name: Gwendolyn [name]  Attending Physician: Delena Serve, MD      Date of Operation:   07/13/2015    Providers Performing:   Surgeon(s):  Delena Serve, MD  [facility], Meridee Score, MD  [facility], Meridee Score, MD  Delena Serve, MD    Relief Circulator: Jacquelin Hawking, RN; Doneta Public, RN; Arther Dames, RN  Relief Scrub: Beverely Pace, RN  Scrub Person: Karle Plumber  First Assistant: Lerry Paterson  Second Scrub Person: Renae Fickle, RN    Operative Procedure:   Procedure(s):  LAPAROSCOPY, DIAGNOSTIC  LAPAROSCOPIC, APPENDECTOMY  LAPAROSCOPIC, LYSIS OF ADHESIONS  LAPAROSCOPIC  SALPINGO - OOPHORECTOMY    Preoperative Diagnosis:   Pre-Op Diagnosis Codes:     * Abdominal pain, right lower quadrant [R10.31]    Postoperative Diagnosis:   Post-Op Diagnosis Codes:     * Abdominal pain, right lower quadrant [R10.31]    Indications:   Abdominal pain    Operative Notes:   After discussion of the risks, benefits, alternatives and common complications including risk of bleeding, infection, damage to surrounding organs including ureters, bladder, bowel and that her pain may not be resolved with this procedure the patient was taken to the operating room with IV fluids running. She was placed in the dorsal supine position. A time out was completed. Antibiotics had been administered and the patient received Lovenox prophylactically. General anesthesia was administered. The patient was then prepped and draped in the usual fashion for laparoscopic abdominal surgery. Please see note by Dr. Ned Grace for details of the appendectomy and lysis of adhesions.  The right fallopian tube was grasped and the infundibulopelvic ligament was grasped with the harmonic and coagulated and transected. The utero-ovarian ligament was then identified and grasped with the harmonic and coagulated and transected. The broad ligament was then transected with the  harmonic. The pedicles were inspected and noted to be hemostatic. The right ovary and fallopian tube were placed in the endocatch bag and removed. For the remainder of the closer please see Dr. Garlan Fair note.     Estimated Blood Loss:   * No values recorded between 07/13/2015  2:50 PM and 07/13/2015  3:36 PM *    Implants:   * No implants in log *    Drains:   Drains: no    Specimens:        SPECIMENS (last 24 hours)      Pathology Specimens       07/13/15 1500             Specimen Information    Specimen Testing Required Routine Pathology       Specimen ID  A       Specimen Description appendix       Specimen Information    Specimen Testing Required Routine Pathology       Specimen ID  B       Specimen Description right tube and ovary           Complications:   None      Signed by: Ronalee Red, MD

## 2015-07-13 NOTE — Brief Op Note (Signed)
BRIEF OP NOTE    Date Time: 07/13/2015 3:33 PM    Patient Name:   Gwendolyn [name]    Date of Operation:   07/13/2015    Providers Performing:   Surgeon(s):  Delena Serve, MD  [facility], Meridee Score, MD  [facility], Meridee Score, MD  Delena Serve, MD    Assistant (s):   Relief Circulator: Jacquelin Hawking, RN; Doneta Public, RN; Arther Dames, RN  Relief Scrub: Beverely Pace, RN  Scrub Person: Karle Plumber  First Assistant: Lerry Paterson  Second Scrub Person: Renae Fickle, RN    Operative Procedure:   Procedure(s):  LAPAROSCOPY, DIAGNOSTIC  LAPAROSCOPIC, APPENDECTOMY  LAPAROSCOPIC, LYSIS OF ADHESIONS  LAPAROSCOPIC  SALPINGO - OOPHORECTOMY    Preoperative Diagnosis:   Pre-Op Diagnosis Codes:     * Abdominal pain, right lower quadrant [R10.31]    Postoperative Diagnosis:   Post-Op Diagnosis Codes:     * Abdominal pain, right lower quadrant [R10.31]    Anesthesia:   General    Estimated Blood Loss:    * No values recorded between 07/13/2015  2:50 PM and 07/13/2015  3:33 PM *    Implants:   * No implants in log *    Drains:   Drains: no    Specimens:        SPECIMENS (last 24 hours)      Pathology Specimens       07/13/15 1500             Specimen Information    Specimen Testing Required Routine Pathology       Specimen ID  A       Specimen Description appendix       Specimen Information    Specimen Testing Required Routine Pathology       Specimen ID  B       Specimen Description right tube and ovary           Findings:   Normal right fallopian tube, mildly enlarged right ovary. Adhesive disease.     Complications:   None    Signed by: Ronalee Red, MD                                                                           [facility]

## 2015-07-13 NOTE — Anesthesia Preprocedure Evaluation (Signed)
Anesthesia Evaluation    AIRWAY    Mallampati: II    TM distance: >3 FB  Neck ROM: full  Mouth Opening:full   CARDIOVASCULAR    cardiovascular exam normal       DENTAL    no notable dental hx     PULMONARY    pulmonary exam normal     OTHER FINDINGS                      Anesthesia Plan    ASA 3     general                     intravenous induction   Detailed anesthesia plan: general endotracheal        Post op pain management: per surgeon

## 2015-07-13 NOTE — Interval H&P Note (Signed)
Patient seen and examined. Labs reviewed-normal.  Plan for diagnostic lap, appendectomy, possible LOA, possible right oophrectomy, fallopian tube removal, possible bowel resection

## 2015-07-13 NOTE — Transfer of Care (Signed)
Anesthesia Transfer of Care Note    Patient: Gwendolyn [name]    Procedures performed: Procedure(s):  LAPAROSCOPY, DIAGNOSTIC  LAPAROSCOPIC, APPENDECTOMY  LAPAROSCOPIC, LYSIS OF ADHESIONS  LAPAROSCOPIC  SALPINGO - OOPHORECTOMY    Anesthesia type: General ETT    Patient location:Phase I PACU    Last vitals:   Filed Vitals:    07/13/15 1547   BP: 142/75   Pulse: 95   Temp: 36.6 C (97.9 F)   Resp: 14   SpO2: 95%       Post pain: Continue adjustment of pain medication; rates pain 6/10 pain medication as ordered in postop continue to monitor     Mental Status:awake and alert     Respiratory Function: tolerating room air    Cardiovascular: stable    Nausea/Vomiting: patient not complaining of nausea or vomiting    Hydration Status: adequate    Post assessment: no apparent anesthetic complications

## 2015-07-13 NOTE — Interval H&P Note (Signed)
Patient seen and examined. H&P reviewed with patient.   No interval change from office exam that is currently scanned into Epic.      Delena Serve, MD, FACS  [facility]  Office: [phone]  Fax [phone]  [phone]

## 2015-07-13 NOTE — Op Note (Signed)
OPERATIVE NOTE    Date Time: 07/13/2015 3:43 PM  Patient Name: Gwendolyn [name]  Attending Physician: Gwendolyn Serve, MD    Date of Operation:   07/13/2015    Providers Performing:   Surgeon(s):  Gwendolyn Serve, MD  [facility], Meridee Score, MD  [facility], Meridee Score, MD  Gwendolyn Serve, MD    Operative Procedure:   Procedure(s):  LAPAROSCOPY, DIAGNOSTIC  LAPAROSCOPIC, APPENDECTOMY  LAPAROSCOPIC, LYSIS OF ADHESIONS  LAPAROSCOPIC  SALPINGO - OOPHORECTOMY    Preoperative Diagnosis:   Pre-Op Diagnosis Codes:     * Abdominal pain, right lower quadrant [R10.31]    Postoperative Diagnosis:   Same as preop    Indications:   Abdominal pain , persistent, chronic epidsodic pain    Details of Procedure:     The patient was placed in the supine position  and general anesthesia was induced, along with placement of SCD. The abdominal hair was clipped and then the abdomen was prepped and draped in a sterile fashion. Antibiotics were given therapeutically and patient voided prior to coming to the operating room.  Local anesthetic was used prior to incision. A one centimeter supraumbilicl incision was made and the peritoneal cavity was accessed using the hassan technique and a 12 mm port. The pneumoperitoneum was then established to steady pressure of 15 mmHg. Additional 5 mm cannulas then placed in the low midline of the abdomen and left lateral position under direct vision.  A 5 mm 30 degree scope was used for the case.   A careful evaluation of the entire abdomen was carried out. The patient was placed in Trendelenburg and left lateral decubitus position. The small intestines were retracted in the cephalad and left lateral direction away from the pelvis and right lower quadrant. The patient was found to have a normal appearing appendix.     The appendix was carefully dissected. The mesoappendix was divided with the Harmonic scalpel.   An endovascular gold load covidien stapler was placed across the base of the appendix flush with  the cecum and fired.  No appendiceal stump was left in place. There was no evidence of bleeding, leakage, or complication after division of the appendix. The endopouch bag was then introduced into the 12 mm port and the appendix was placed in the bag and then this was removed.  Some adhesions of the ascending colon was noted to the abdominal wall, and I took these down with harmonic scalpel.  We then explored the rest of the abdomen.  Ovaries were then assessed by Dr. Ninfa Meeker who proceeded with the right oophrectomy/salpingectomy.   The 5mm ports were removed under direct visualization and no bleeding was appreciated.  The 12mm port and the specimen were removed from the umbilical site.   The umbilical port site was closed using 0 ethibond sutures in a figure eight fashion at the level of the fascia. The skin was closed using 4-0 monocryl.  Surgical glue was used on the incisions.  Instrument, sponge, and needle counts were correct at the conclusion of the case.   The patient was transferred to the recovery room in stable condition.       Estimated Blood Loss:   < 25 ml    Replaced fluids:   1200 ml    Implants:   * No implants in log *    Drains:   Drain #1:   None    Specimens:        SPECIMENS (last 24 hours)  Pathology Specimens       07/13/15 1500             Specimen Information    Specimen Testing Required Routine Pathology       Specimen ID  A       Specimen Description appendix       Specimen Information    Specimen Testing Required Routine Pathology       Specimen ID  B       Specimen Description right tube and ovary           Complications:   * No complications entered in OR log *      Signed by: Gwendolyn Serve, MD

## 2015-07-13 NOTE — Discharge Instructions (Addendum)
No lifting over 15 lbs x 6 weeks  No abdominal exercise x 6 weeks  May shower in 36 hours, allowing soap and water to run over incisions, but only pat them dry, do not scrub at them.  Do not remove glue on skin  Apply ice pack to incision area for 20 minutes ever 2 hours x 24-48 hours  May drive when off narcotic pain for 24 hours meds and comfortable      Narcotic medications cause constipation. I recommend taking ibuprofen 600 mg every 6 hours or naproxen  Or aleve ( 2 tabs of 220mg ) every 12 hours for your baseline medication and then the narcotic (percocet or vicodin) as a breakthrough medication for pain. Do not take ibuprofen if you have a history of stomach ulcers or are allergic to aspirin.     Purchase a stool softener to take while on the narcotic medication and use it twice a day to avoid constipation. Stop if having loose stools.        Post Anesthesia Discharge Instructions    Although you may be awake and alert in the recovery room, small amounts of anesthetic remain in your system for about 24 hours.  You may feel tired and sleepy during this time.      You are advised to go directly home from the hospital.    Plan to stay at home and rest for the remainder of the day.    It is advisable to have someone with you at home for 24 hours after surgery.    Do not operate a motor vehicle, or any mechanical or electrical equipment for the next 24 hours.      Be careful when you are walking around, you may become dizzy.  The effects of anesthesia and/or medications are still present and drowsiness may occur    Do not consume alcohol, tranquilizers, sleeping medications, or any other non prescribed medication for the remainder of the day.    Diet:  begin with liquids, progress your diet as tolerated or as directed by your surgeon.  Nausea and vomiting may occur in the next 24 hours.

## 2015-07-14 ENCOUNTER — Encounter: Payer: Self-pay | Admitting: Surgery

## 2015-07-17 LAB — LAB USE ONLY - HISTORICAL SURGICAL PATHOLOGY

## 2015-07-19 ENCOUNTER — Encounter (HOSPITAL_BASED_OUTPATIENT_CLINIC_OR_DEPARTMENT_OTHER): Payer: Self-pay | Admitting: Obstetrics & Gynecology

## 2015-07-25 ENCOUNTER — Telehealth (HOSPITAL_BASED_OUTPATIENT_CLINIC_OR_DEPARTMENT_OTHER): Payer: Self-pay | Admitting: Obstetrics & Gynecology

## 2015-07-25 NOTE — Telephone Encounter (Signed)
Called to check on patient post-op. Left message to return my call.

## 2015-07-29 ENCOUNTER — Other Ambulatory Visit (INDEPENDENT_AMBULATORY_CARE_PROVIDER_SITE_OTHER): Payer: Self-pay | Admitting: Internal Medicine

## 2015-08-03 ENCOUNTER — Encounter (INDEPENDENT_AMBULATORY_CARE_PROVIDER_SITE_OTHER): Payer: Self-pay | Admitting: Internal Medicine

## 2015-08-08 ENCOUNTER — Encounter (INDEPENDENT_AMBULATORY_CARE_PROVIDER_SITE_OTHER): Payer: Self-pay | Admitting: Internal Medicine

## 2015-08-08 ENCOUNTER — Ambulatory Visit (INDEPENDENT_AMBULATORY_CARE_PROVIDER_SITE_OTHER): Payer: No Typology Code available for payment source | Admitting: Internal Medicine

## 2015-08-08 VITALS — BP 108/75 | HR 69 | Temp 98.1°F | Ht 64.0 in | Wt 239.0 lb

## 2015-08-08 DIAGNOSIS — Z9889 Other specified postprocedural states: Secondary | ICD-10-CM

## 2015-08-08 NOTE — Progress Notes (Signed)
Have you sought any care outside of the [facility]?  No

## 2015-08-08 NOTE — Progress Notes (Signed)
Subjective:       Patient ID: Gwendolyn [name] is a 54 y.o. female.    HPI Comments: Post Op Visit  54 year old in for post op visit for having her appendix out and ovaries out. She is doing well and has no major issues.       The following portions of the patient's history were reviewed and updated as appropriate: allergies, current medications, past family history, past medical history, past social history, past surgical history and problem list.    Review of Systems   Constitutional: Negative for diaphoresis, fatigue and unexpected weight change.   HENT: Negative for congestion, dental problem, ear pain, hearing loss, rhinorrhea, sinus pressure, sneezing, sore throat and trouble swallowing.    Eyes: Negative for pain and visual disturbance.   Respiratory: Negative for apnea, cough, chest tightness and shortness of breath.    Cardiovascular: Negative for chest pain, palpitations and leg swelling.   Gastrointestinal: Negative for nausea, vomiting, abdominal pain, diarrhea, constipation and blood in stool.   Genitourinary: Negative for dysuria, urgency, frequency, hematuria, decreased urine volume, vaginal discharge, difficulty urinating, vaginal pain, pelvic pain and dyspareunia.   Musculoskeletal: Negative for myalgias and arthralgias.   Skin: Negative for color change and rash.   Neurological: Negative for dizziness, tremors, weakness and headaches.   Hematological: Negative for adenopathy.   Psychiatric/Behavioral: Negative for suicidal ideas, sleep disturbance, self-injury and agitation.           Objective:    Physical Exam   Constitutional: She is oriented to person, place, and time. She appears well-developed and well-nourished.   HENT:   Head: Normocephalic and atraumatic.   Right Ear: External ear normal.   Left Ear: External ear normal.   Nose: Nose normal.   Mouth/Throat: Oropharynx is clear and moist.   Eyes: EOM are normal. Right eye exhibits no discharge. Left eye exhibits no discharge.   Neck: Normal  range of motion. Neck supple. No JVD present. No thyromegaly present.   Cardiovascular: Normal rate, regular rhythm, normal heart sounds and intact distal pulses.    No murmur heard.  Pulmonary/Chest: Effort normal and breath sounds normal.   Abdominal: Soft. Bowel sounds are normal. She exhibits no distension and no mass. There is no rebound and no guarding.   Musculoskeletal: Normal range of motion. She exhibits no edema or tenderness.   Lymphadenopathy:     She has no cervical adenopathy.   Neurological: She is oriented to person, place, and time. She has normal reflexes.   Skin: Skin is warm and dry.   Psychiatric: She has a normal mood and affect. Her behavior is normal.   Nursing note and vitals reviewed.          Assessment:           54 year old in for post op visit  Plan:      Procedures        Scars healing well

## 2015-08-26 ENCOUNTER — Other Ambulatory Visit (INDEPENDENT_AMBULATORY_CARE_PROVIDER_SITE_OTHER): Payer: Self-pay | Admitting: Internal Medicine

## 2015-09-21 ENCOUNTER — Other Ambulatory Visit (INDEPENDENT_AMBULATORY_CARE_PROVIDER_SITE_OTHER): Payer: Self-pay | Admitting: Internal Medicine

## 2015-09-24 ENCOUNTER — Telehealth (INDEPENDENT_AMBULATORY_CARE_PROVIDER_SITE_OTHER): Payer: Self-pay | Admitting: Internal Medicine

## 2015-09-24 MED ORDER — VALACYCLOVIR HCL 1 G PO TABS
1000.0000 mg | ORAL_TABLET | Freq: Every day | ORAL | Status: DC
Start: 2015-09-24 — End: 2016-08-20

## 2015-09-24 NOTE — Telephone Encounter (Signed)
On call note:  Valtrex refilled per pt request

## 2015-10-11 ENCOUNTER — Other Ambulatory Visit (INDEPENDENT_AMBULATORY_CARE_PROVIDER_SITE_OTHER): Payer: Self-pay | Admitting: Internal Medicine

## 2015-10-19 ENCOUNTER — Other Ambulatory Visit (INDEPENDENT_AMBULATORY_CARE_PROVIDER_SITE_OTHER): Payer: Self-pay | Admitting: Internal Medicine

## 2015-11-01 ENCOUNTER — Ambulatory Visit (INDEPENDENT_AMBULATORY_CARE_PROVIDER_SITE_OTHER): Payer: No Typology Code available for payment source | Admitting: Internal Medicine

## 2015-11-01 ENCOUNTER — Encounter (INDEPENDENT_AMBULATORY_CARE_PROVIDER_SITE_OTHER): Payer: Self-pay | Admitting: Internal Medicine

## 2015-11-01 VITALS — BP 118/78 | HR 90 | Temp 98.0°F | Ht 64.0 in | Wt 244.0 lb

## 2015-11-01 DIAGNOSIS — M25511 Pain in right shoulder: Secondary | ICD-10-CM

## 2015-11-01 DIAGNOSIS — M25559 Pain in unspecified hip: Secondary | ICD-10-CM

## 2015-11-01 DIAGNOSIS — F5101 Primary insomnia: Secondary | ICD-10-CM

## 2015-11-01 DIAGNOSIS — G8929 Other chronic pain: Secondary | ICD-10-CM

## 2015-11-01 MED ORDER — ESZOPICLONE 3 MG PO TABS
3.0000 mg | ORAL_TABLET | Freq: Every evening | ORAL | Status: DC
Start: 2015-11-01 — End: 2016-02-05

## 2015-11-01 NOTE — Progress Notes (Signed)
Subjective:       Patient ID: Gwendolyn [name] is a 54 y.o. female.    Shoulder Pain   The pain is present in the right shoulder. This is a new problem. The current episode started in the past 7 days. There has been a history of trauma. The problem occurs constantly. Associated symptoms include an inability to bear weight, joint locking, joint swelling and a limited range of motion. Pertinent negatives include no fever, itching, numbness or stiffness.       The following portions of the patient's history were reviewed and updated as appropriate: allergies, current medications, past family history, past medical history, past social history, past surgical history and problem list.    Review of Systems   Constitutional: Negative for fever, diaphoresis, fatigue and unexpected weight change.   HENT: Negative for congestion, dental problem, ear pain, hearing loss, rhinorrhea, sinus pressure, sneezing, sore throat and trouble swallowing.    Eyes: Negative for pain and visual disturbance.   Respiratory: Negative for apnea, cough, chest tightness and shortness of breath.    Cardiovascular: Negative for chest pain, palpitations and leg swelling.   Gastrointestinal: Negative for nausea, vomiting, abdominal pain, diarrhea, constipation and blood in stool.   Genitourinary: Negative for dysuria, urgency, frequency, hematuria, decreased urine volume, vaginal discharge, difficulty urinating, vaginal pain, pelvic pain and dyspareunia.   Musculoskeletal: Negative for myalgias, arthralgias and stiffness.   Skin: Negative for color change, itching and rash.   Neurological: Negative for dizziness, tremors, weakness, numbness and headaches.   Hematological: Negative for adenopathy.   Psychiatric/Behavioral: Negative for suicidal ideas, sleep disturbance, self-injury and agitation.           Objective:    Physical Exam   Constitutional: She is oriented to person, place, and time. She appears well-developed and well-nourished.   HENT:   Head:  Normocephalic and atraumatic.   Right Ear: External ear normal.   Left Ear: External ear normal.   Nose: Nose normal.   Mouth/Throat: Oropharynx is clear and moist.   Eyes: EOM are normal. Right eye exhibits no discharge. Left eye exhibits no discharge.   Neck: Normal range of motion. Neck supple. No JVD present. No thyromegaly present.   Cardiovascular: Normal rate, regular rhythm, normal heart sounds and intact distal pulses.    No murmur heard.  Pulmonary/Chest: Effort normal and breath sounds normal.   Abdominal: Soft. Bowel sounds are normal. She exhibits no distension and no mass. There is no rebound and no guarding.   Musculoskeletal: She exhibits no edema or tenderness.   Lymphadenopathy:     She has no cervical adenopathy.   Neurological: She is oriented to person, place, and time. She has normal reflexes.   Skin: Skin is warm and dry.   Psychiatric: She has a normal mood and affect. Her behavior is normal.   Nursing note and vitals reviewed.          Assessment:       54 year old in for right shoulder injury      Plan:      Procedures          Had X ray   Had accident  ROM decreased  Check MRI   Go to Dr Juanita Laster

## 2015-11-06 ENCOUNTER — Ambulatory Visit: Payer: No Typology Code available for payment source | Attending: Internal Medicine

## 2015-11-06 ENCOUNTER — Ambulatory Visit: Payer: No Typology Code available for payment source

## 2015-11-06 DIAGNOSIS — G8929 Other chronic pain: Secondary | ICD-10-CM | POA: Insufficient documentation

## 2015-11-06 DIAGNOSIS — M75111 Incomplete rotator cuff tear or rupture of right shoulder, not specified as traumatic: Secondary | ICD-10-CM | POA: Insufficient documentation

## 2015-11-06 DIAGNOSIS — M25559 Pain in unspecified hip: Secondary | ICD-10-CM | POA: Insufficient documentation

## 2015-11-06 DIAGNOSIS — M25511 Pain in right shoulder: Secondary | ICD-10-CM | POA: Insufficient documentation

## 2015-11-06 DIAGNOSIS — M25811 Other specified joint disorders, right shoulder: Secondary | ICD-10-CM | POA: Insufficient documentation

## 2015-11-07 ENCOUNTER — Encounter (INDEPENDENT_AMBULATORY_CARE_PROVIDER_SITE_OTHER): Payer: Self-pay | Admitting: Internal Medicine

## 2015-11-20 ENCOUNTER — Other Ambulatory Visit (INDEPENDENT_AMBULATORY_CARE_PROVIDER_SITE_OTHER): Payer: Self-pay | Admitting: Internal Medicine

## 2015-12-01 ENCOUNTER — Other Ambulatory Visit (INDEPENDENT_AMBULATORY_CARE_PROVIDER_SITE_OTHER): Payer: Self-pay | Admitting: Internal Medicine

## 2015-12-11 ENCOUNTER — Encounter (INDEPENDENT_AMBULATORY_CARE_PROVIDER_SITE_OTHER): Payer: Self-pay | Admitting: Internal Medicine

## 2015-12-11 ENCOUNTER — Ambulatory Visit (INDEPENDENT_AMBULATORY_CARE_PROVIDER_SITE_OTHER): Payer: No Typology Code available for payment source | Admitting: Internal Medicine

## 2015-12-11 VITALS — BP 124/85 | HR 99 | Temp 98.3°F

## 2015-12-11 DIAGNOSIS — F5101 Primary insomnia: Secondary | ICD-10-CM

## 2015-12-11 NOTE — Progress Notes (Signed)
Have you sought any care outside of the [facility]?  No

## 2015-12-30 ENCOUNTER — Other Ambulatory Visit (INDEPENDENT_AMBULATORY_CARE_PROVIDER_SITE_OTHER): Payer: Self-pay | Admitting: Family Medicine

## 2016-01-04 ENCOUNTER — Other Ambulatory Visit (INDEPENDENT_AMBULATORY_CARE_PROVIDER_SITE_OTHER): Payer: Self-pay | Admitting: Internal Medicine

## 2016-01-24 ENCOUNTER — Other Ambulatory Visit (INDEPENDENT_AMBULATORY_CARE_PROVIDER_SITE_OTHER): Payer: Self-pay | Admitting: Internal Medicine

## 2016-01-29 ENCOUNTER — Other Ambulatory Visit (INDEPENDENT_AMBULATORY_CARE_PROVIDER_SITE_OTHER): Payer: Self-pay | Admitting: Internal Medicine

## 2016-02-02 ENCOUNTER — Encounter (INDEPENDENT_AMBULATORY_CARE_PROVIDER_SITE_OTHER): Payer: Self-pay | Admitting: Internal Medicine

## 2016-02-05 ENCOUNTER — Other Ambulatory Visit (INDEPENDENT_AMBULATORY_CARE_PROVIDER_SITE_OTHER): Payer: Self-pay | Admitting: Internal Medicine

## 2016-02-05 DIAGNOSIS — F5101 Primary insomnia: Secondary | ICD-10-CM

## 2016-02-05 MED ORDER — ESZOPICLONE 3 MG PO TABS
3.0000 mg | ORAL_TABLET | Freq: Every evening | ORAL | Status: DC
Start: 2016-02-05 — End: 2016-03-25

## 2016-02-21 ENCOUNTER — Other Ambulatory Visit (INDEPENDENT_AMBULATORY_CARE_PROVIDER_SITE_OTHER): Payer: Self-pay | Admitting: Internal Medicine

## 2016-02-28 ENCOUNTER — Other Ambulatory Visit (INDEPENDENT_AMBULATORY_CARE_PROVIDER_SITE_OTHER): Payer: Self-pay | Admitting: Internal Medicine

## 2016-02-29 ENCOUNTER — Ambulatory Visit (INDEPENDENT_AMBULATORY_CARE_PROVIDER_SITE_OTHER): Payer: No Typology Code available for payment source | Admitting: Internal Medicine

## 2016-03-12 ENCOUNTER — Other Ambulatory Visit (INDEPENDENT_AMBULATORY_CARE_PROVIDER_SITE_OTHER): Payer: Self-pay | Admitting: Internal Medicine

## 2016-03-25 ENCOUNTER — Encounter (INDEPENDENT_AMBULATORY_CARE_PROVIDER_SITE_OTHER): Payer: Self-pay | Admitting: Internal Medicine

## 2016-03-25 ENCOUNTER — Ambulatory Visit (INDEPENDENT_AMBULATORY_CARE_PROVIDER_SITE_OTHER): Payer: BC Managed Care – PPO | Admitting: Internal Medicine

## 2016-03-25 VITALS — BP 129/79 | HR 92 | Temp 98.3°F | Ht 64.0 in | Wt 245.0 lb

## 2016-03-25 DIAGNOSIS — F419 Anxiety disorder, unspecified: Secondary | ICD-10-CM

## 2016-03-25 DIAGNOSIS — Z0000 Encounter for general adult medical examination without abnormal findings: Secondary | ICD-10-CM

## 2016-03-25 DIAGNOSIS — F5101 Primary insomnia: Secondary | ICD-10-CM

## 2016-03-25 MED ORDER — ESZOPICLONE 3 MG PO TABS
3.0000 mg | ORAL_TABLET | Freq: Every evening | ORAL | Status: DC
Start: 2016-03-25 — End: 2016-04-11

## 2016-03-25 MED ORDER — CLONAZEPAM 1 MG PO TABS
1.0000 mg | ORAL_TABLET | ORAL | Status: DC | PRN
Start: 2016-03-25 — End: 2016-03-25

## 2016-03-25 MED ORDER — BUPROPION HCL ER (XL) 150 MG PO TB24
150.0000 mg | ORAL_TABLET | Freq: Every day | ORAL | Status: DC
Start: 2016-03-25 — End: 2016-06-07

## 2016-03-25 MED ORDER — ALPRAZOLAM 0.5 MG PO TABS
0.5000 mg | ORAL_TABLET | Freq: Two times a day (BID) | ORAL | Status: DC | PRN
Start: 2016-03-25 — End: 2016-04-11

## 2016-03-25 NOTE — Progress Notes (Signed)
Subjective:      Patient ID: Gwendolyn [name] is a 55 y.o. female.    Chief Complaint:  Chief Complaint   Patient presents with   . Insomnia       HPI:  Anxiety  Presents for follow-up visit. Symptoms include excessive worry, insomnia, nervous/anxious behavior and panic. Patient reports no chest pain, compulsions, decreased concentration, dizziness, hyperventilation, impotence, irritability, nausea, palpitations, shortness of breath or suicidal ideas.           Problem List:  Patient Active Problem List   Diagnosis   . Right ovarian cyst   . Pain, female pelvic   . Right lower quadrant abdominal pain       Current Medications:  Current Outpatient Prescriptions   Medication Sig Dispense Refill   . acetaZOLAMIDE (DIAMOX) 125 MG tablet Take 125 mg by mouth 3 (three) times daily. Takes as needed. Last dose about 3 months ago. Takes for dizziness.       Mack Guise THYROID 15 MG tablet TAKE 1 TABLET BY MOUTH DAILY 90 tablet 0   . ARMOUR THYROID 30 MG tablet TAKE 1 TABLET(30 MG) BY MOUTH DAILY 30 tablet 0   . clonazePAM (KLONOPIN) 1 MG tablet Take 1 tablet (1 mg total) by mouth as needed for Anxiety. 30 tablet 0   . diazepam (VALIUM) 5 MG tablet   0   . DULoxetine (CYMBALTA) 30 MG capsule TAKE ONE CAPSULE BY MOUTH DAILY 30 capsule 0   . ergocalciferol (ERGOCALCIFEROL) 50000 UNITS capsule Take 1 capsule (50,000 Units total) by mouth once a week. 8 capsule 1   . Eszopiclone 3 MG tablet Take 1 tablet (3 mg total) by mouth nightly. Take immediately before bedtime 30 tablet 0   . HYDROcodone-acetaminophen (NORCO) 5-325 MG per tablet TK 1 T PO  Q 8 H PRF BREAKTHROUGH PAIN  0   . naproxen sodium (ANAPROX) 220 MG tablet Take 220 mg by mouth as needed.     Marland Kitchen oxyCODONE-acetaminophen (PERCOCET) 7.5-325 MG per tablet Take 1 tablet by mouth every 6 (six) hours as needed for Pain. 120 tablet 0   . tiZANidine (ZANAFLEX) 4 MG tablet Take 4 mg by mouth as needed.     1   . TRANSDERM-SCOP, 1.5 MG, 1 MG/3DAYS      . valacyclovir (VALTREX) 1000 MG  tablet Take 1 tablet (1,000 mg total) by mouth daily. As needed for cold sores 20 tablet 0   . verapamil (CALAN) 40 MG tablet Take 40 mg by mouth as needed. As needed for SVT       . vitamin D, ergocalciferol, (DRISDOL) 50000 UNIT Cap TAKE 1 CAPSULE BY MOUTH EVERY WEEK 8 capsule 0   . VOLTAREN 1 % Gel topical gel   0     No current facility-administered medications for this visit.       Allergies:  Allergies   Allergen Reactions   . Beef-Derived Products Anaphylaxis     And Pork products   . Erythromycin Hives   . Other Swelling and Respiratory Distress     Bee sting    . Penicillin V Hives   . Penicillins Hives       Past Medical History:  Past Medical History   Diagnosis Date   . Disorder of thyroid    . Anxiety    . Depression    . Back pain    . SVT (supraventricular tachycardia)    . Herpes    . Ovarian cyst    .  Health care maintenance very nice patient      has had EKG changes- Stress test done 6 years ago normal Dr Fayrene Helper husband is my patient  Colo done 2015 Pap 2015  Mammogram 2015   . Hypothyroidism    . Calculus of kidney    . Low back pain    . Arrhythmia      Occasional SVT. (Happens rarely)    . Procreative management    . Post-operative nausea and vomiting      APFEL 3, after last laparoscopy woke up w/ SEVERE pain   . Hx of appendectomy      06/2015       Past Surgical History:  Past Surgical History   Procedure Laterality Date   . Reduction mammaplasty     . Foot surgery     . Tonsillectomy     . Colonoscopy     . Laparoscopic, operative N/A 11/22/2014     Procedure: LAPAROSCOPIC OVARIAN CYSTOTOMY;  Surgeon: Waldon Merl, MD;  Location: [facility];  Service: Gynecology;  Laterality: N/A;   . Laparoscopic, lysis, adhesions N/A 11/22/2014     Procedure: LAPAROSCOPIC, LYSIS, ADHESIONS;  Surgeon: Waldon Merl, MD;  Location: [facility];  Service: Gynecology;  Laterality: N/A;   . Laparoscopy, diagnostic N/A 07/13/2015     Procedure: LAPAROSCOPY, DIAGNOSTIC;  Surgeon: Delena Serve, MD;  Location: [facility];  Service: General;  Laterality: N/A;   . Laparoscopic, appendectomy N/A 07/13/2015     Procedure: LAPAROSCOPIC, APPENDECTOMY;  Surgeon: Delena Serve, MD;  Location: [facility];  Service: General;  Laterality: N/A;   . Laparoscopic, enterolysis N/A 07/13/2015     Procedure: LAPAROSCOPIC, LYSIS OF ADHESIONS;  Surgeon: Delena Serve, MD;  Location: [facility];  Service: General;  Laterality: N/A;   . Laparoscopic, operative N/A 07/13/2015     Procedure: LAPAROSCOPIC  SALPINGO - OOPHORECTOMY;  Surgeon: Ronalee Red, MD;  Location: [facility];  Service: Gynecology;  Laterality: N/A;       Family History:  Family History   Problem Relation Age of Onset   . Hyperlipidemia Mother    . Hyperlipidemia Father    . Skin cancer Sister    . Breast cancer Sister 83   . Diabetes Sister    . Hyperlipidemia Sister    . Heart attack Brother    . Hyperlipidemia Brother    . Breast cancer Maternal Aunt 54   . Cervical cancer Maternal Aunt    . Lung cancer Maternal Uncle    . Brain cancer Paternal Uncle    . Diabetes Maternal Grandmother    . Diabetes Paternal Grandfather    . Colon cancer Neg Hx    . Ovarian cancer Neg Hx    . Cancer Cousin        Social History:  Social History     Social History   . Marital Status: Married     Spouse Name: N/A   . Number of Children: N/A   . Years of Education: N/A     Occupational History   . Not on file.     Social History Main Topics   . Smoking status: Never Smoker    . Smokeless tobacco: Never Used   . Alcohol Use: 0.6 oz/week     1 Standard drinks or equivalent per week      Comment: rare   . Drug Use: No   . Sexual  Activity:     Partners: Male     Copy: None     Other Topics Concern   . Not on file     Social History Narrative       The following portions of the patient's history were reviewed and updated as appropriate: allergies, current medications, past family history, past medical history, past social history, past  surgical history and problem list.    ROS:  Review of Systems   Constitutional: Negative for diaphoresis, irritability, fatigue and unexpected weight change.   HENT: Negative for congestion, dental problem, ear pain, hearing loss, rhinorrhea, sinus pressure, sneezing, sore throat and trouble swallowing.    Eyes: Negative for pain and visual disturbance.   Respiratory: Negative for apnea, cough, chest tightness and shortness of breath.    Cardiovascular: Negative for chest pain, palpitations and leg swelling.   Gastrointestinal: Negative for nausea, vomiting, abdominal pain, diarrhea, constipation and blood in stool.   Genitourinary: Negative for dysuria, urgency, frequency, hematuria, impotence, decreased urine volume, vaginal discharge, difficulty urinating, vaginal pain, pelvic pain and dyspareunia.   Musculoskeletal: Negative for myalgias and arthralgias.   Skin: Negative for color change and rash.   Neurological: Negative for dizziness, tremors, weakness and headaches.   Hematological: Negative for adenopathy.   Psychiatric/Behavioral: Negative for suicidal ideas, sleep disturbance, self-injury, decreased concentration and agitation. The patient is nervous/anxious and has insomnia.        Vitals:  BP 129/79 mmHg  Pulse 92  Temp(Src) 98.3 F (36.8 C) (Oral)  Ht 1.626 m (5\' 4" )  Wt 111.131 kg (245 lb)  BMI 42.03 kg/m2  SpO2 99%    Objective:     Physical Exam:  Physical Exam   Constitutional: She is oriented to person, place, and time. She appears well-developed and well-nourished.   HENT:   Head: Normocephalic and atraumatic.   Right Ear: External ear normal.   Left Ear: External ear normal.   Nose: Nose normal.   Mouth/Throat: Oropharynx is clear and moist.   Eyes: EOM are normal. Right eye exhibits no discharge. Left eye exhibits no discharge.   Neck: Normal range of motion. Neck supple. No JVD present. No thyromegaly present.   Cardiovascular: Normal rate, regular rhythm, normal heart sounds and intact  distal pulses.    No murmur heard.  Pulmonary/Chest: Effort normal and breath sounds normal.   Abdominal: Soft. Bowel sounds are normal. She exhibits no distension and no mass. There is no rebound and no guarding.   Musculoskeletal: Normal range of motion. She exhibits no edema or tenderness.   Lymphadenopathy:     She has no cervical adenopathy.   Neurological: She is oriented to person, place, and time. She has normal reflexes.   Skin: Skin is warm and dry.   Psychiatric: She has a normal mood and affect. Her behavior is normal.   Nursing note and vitals reviewed.      Assessment:     1. Primary insomnia  - Eszopiclone 3 MG tablet; Take 1 tablet (3 mg total) by mouth nightly. Take immediately before bedtime  Dispense: 30 tablet; Refill: 0      3. Anxiety  - clonazePAM (KLONOPIN) 1 MG tablet; Take 1 tablet (1 mg total) by mouth as needed for Anxiety.  Dispense: 30 tablet; Refill: 0      Plan:         Francys Bolin Angelene Giovanni, MD

## 2016-03-27 ENCOUNTER — Other Ambulatory Visit: Payer: Self-pay | Admitting: Physician Assistant

## 2016-03-27 ENCOUNTER — Ambulatory Visit
Admission: RE | Admit: 2016-03-27 | Discharge: 2016-03-27 | Disposition: A | Discharge status: Home / Self Care | Payer: BC Managed Care – PPO | Source: Ambulatory Visit | Attending: Physician Assistant | Admitting: Physician Assistant

## 2016-03-27 DIAGNOSIS — M533 Sacrococcygeal disorders, not elsewhere classified: Secondary | ICD-10-CM

## 2016-03-27 DIAGNOSIS — M461 Sacroiliitis, not elsewhere classified: Secondary | ICD-10-CM

## 2016-03-27 DIAGNOSIS — M545 Low back pain: Secondary | ICD-10-CM

## 2016-03-27 DIAGNOSIS — M2550 Pain in unspecified joint: Secondary | ICD-10-CM | POA: Insufficient documentation

## 2016-03-27 DIAGNOSIS — M5136 Other intervertebral disc degeneration, lumbar region: Secondary | ICD-10-CM

## 2016-03-27 LAB — RHEUMATOID FACTOR: Rheumatoid Factor: <15 (ref 0.0–30.0)

## 2016-03-27 LAB — T3, FREE: T3, Free: 3.67 pg/mL (ref 1.71–3.71)

## 2016-03-27 LAB — C-REACTIVE PROTEIN: C-Reactive Protein: 1.7 mg/dL — ABNORMAL HIGH (ref 0.0–0.8)

## 2016-03-28 LAB — HLA-B27 ANTIGEN: HLA-B27 Antigen: NEGATIVE

## 2016-04-01 ENCOUNTER — Other Ambulatory Visit (INDEPENDENT_AMBULATORY_CARE_PROVIDER_SITE_OTHER): Payer: Self-pay | Admitting: Internal Medicine

## 2016-04-02 ENCOUNTER — Ambulatory Visit: Payer: BC Managed Care – PPO | Attending: Physician Assistant

## 2016-04-02 DIAGNOSIS — M545 Low back pain: Secondary | ICD-10-CM | POA: Insufficient documentation

## 2016-04-02 DIAGNOSIS — M533 Sacrococcygeal disorders, not elsewhere classified: Secondary | ICD-10-CM

## 2016-04-02 DIAGNOSIS — M461 Sacroiliitis, not elsewhere classified: Secondary | ICD-10-CM

## 2016-04-02 DIAGNOSIS — M5136 Other intervertebral disc degeneration, lumbar region: Secondary | ICD-10-CM

## 2016-04-10 ENCOUNTER — Encounter (INDEPENDENT_AMBULATORY_CARE_PROVIDER_SITE_OTHER): Payer: Self-pay | Admitting: Internal Medicine

## 2016-04-11 ENCOUNTER — Ambulatory Visit (INDEPENDENT_AMBULATORY_CARE_PROVIDER_SITE_OTHER): Payer: BC Managed Care – PPO | Admitting: Internal Medicine

## 2016-04-11 ENCOUNTER — Encounter (INDEPENDENT_AMBULATORY_CARE_PROVIDER_SITE_OTHER): Payer: Self-pay | Admitting: Internal Medicine

## 2016-04-11 VITALS — BP 139/84 | HR 99 | Temp 98.2°F | Ht 64.0 in | Wt 238.0 lb

## 2016-04-11 DIAGNOSIS — E7800 Pure hypercholesterolemia, unspecified: Secondary | ICD-10-CM

## 2016-04-11 DIAGNOSIS — Z0000 Encounter for general adult medical examination without abnormal findings: Secondary | ICD-10-CM

## 2016-04-11 DIAGNOSIS — R739 Hyperglycemia, unspecified: Secondary | ICD-10-CM

## 2016-04-11 DIAGNOSIS — F5101 Primary insomnia: Secondary | ICD-10-CM

## 2016-04-11 DIAGNOSIS — R7982 Elevated C-reactive protein (CRP): Secondary | ICD-10-CM

## 2016-04-11 DIAGNOSIS — E559 Vitamin D deficiency, unspecified: Secondary | ICD-10-CM

## 2016-04-11 DIAGNOSIS — F419 Anxiety disorder, unspecified: Secondary | ICD-10-CM

## 2016-04-11 LAB — CK: Creatine Kinase (CK): 106 U/L (ref 29–168)

## 2016-04-11 LAB — URINALYSIS
Blood, UA: NEGATIVE
Glucose, UA: NEGATIVE
Leukocyte Esterase, UA: NEGATIVE
Nitrite, UA: NEGATIVE
Specific Gravity UA: 1.033 (ref 1.001–1.035)
Urine pH: 5.5 (ref 5.0–8.0)
Urobilinogen, UA: 0.2 (ref 0.2–2.0)

## 2016-04-11 LAB — COMPREHENSIVE METABOLIC PANEL
ALT: 18 U/L (ref 0–55)
AST (SGOT): 18 U/L (ref 5–34)
Albumin/Globulin Ratio: 1.3 (ref 0.9–2.2)
Albumin: 3.9 g/dL (ref 3.5–5.0)
Alkaline Phosphatase: 131 U/L — ABNORMAL HIGH (ref 37–106)
BUN: 15 mg/dL (ref 7.0–19.0)
Bilirubin, Total: 0.5 mg/dL (ref 0.1–1.2)
CO2: 24 mEq/L (ref 21–30)
Calcium: 9.9 mg/dL (ref 8.5–10.5)
Chloride: 107 mEq/L (ref 100–111)
Creatinine: 1 mg/dL (ref 0.4–1.5)
Globulin: 3 g/dL (ref 2.0–3.7)
Glucose: 97 mg/dL (ref 70–100)
Potassium: 4.7 mEq/L (ref 3.5–5.3)
Protein, Total: 6.9 g/dL (ref 6.0–8.3)
Sodium: 145 mEq/L (ref 135–146)

## 2016-04-11 LAB — CBC
Hematocrit: 46.6 % (ref 37.0–47.0)
Hgb: 14.1 g/dL (ref 12.0–16.0)
MCH: 29.3 pg (ref 28.0–32.0)
MCHC: 30.3 g/dL — ABNORMAL LOW (ref 32.0–36.0)
MCV: 96.7 fL (ref 80.0–100.0)
MPV: 10.4 fL (ref 9.4–12.3)
Nucleated RBC: 0 /100 WBC (ref 0–1)
Platelets: 287 10*3/uL (ref 140–400)
RBC: 4.82 10*6/uL (ref 4.20–5.40)
RDW: 15 % (ref 12–15)
WBC: 7.76 10*3/uL (ref 3.50–10.80)

## 2016-04-11 LAB — LIPID PANEL
Cholesterol / HDL Ratio: 4
Cholesterol: 238 mg/dL — ABNORMAL HIGH (ref 0–199)
HDL: 60 mg/dL (ref 40–9999)
LDL Calculated: 154 mg/dL — ABNORMAL HIGH (ref 0–99)
Triglycerides: 121 mg/dL (ref 34–149)
VLDL Calculated: 24 mg/dL (ref 10–40)

## 2016-04-11 LAB — TSH: TSH: 1.21 u[IU]/mL (ref 0.35–4.94)

## 2016-04-11 LAB — C-REACTIVE PROTEIN: C-Reactive Protein: 1.1 mg/dL — ABNORMAL HIGH (ref 0.0–0.8)

## 2016-04-11 LAB — HEMOGLOBIN A1C
Average Estimated Glucose: 116.9 mg/dL
Hemoglobin A1C: 5.7 % (ref 4.6–5.9)

## 2016-04-11 LAB — GFR: EGFR: 58

## 2016-04-11 LAB — VITAMIN D,25 OH,TOTAL: Vitamin D, 25 OH, Total: 28 ng/mL — ABNORMAL LOW (ref 30–100)

## 2016-04-11 LAB — HEMOLYSIS INDEX: Hemolysis Index: 15 (ref 0–18)

## 2016-04-11 MED ORDER — ESZOPICLONE 3 MG PO TABS
3.0000 mg | ORAL_TABLET | Freq: Every evening | ORAL | Status: DC
Start: 2016-04-11 — End: 2016-05-20

## 2016-04-11 MED ORDER — ALPRAZOLAM 0.5 MG PO TABS
0.5000 mg | ORAL_TABLET | Freq: Two times a day (BID) | ORAL | Status: DC | PRN
Start: 2016-04-11 — End: 2016-05-20

## 2016-04-11 NOTE — Progress Notes (Signed)
Subjective:      Patient ID: Gwendolyn [name] is a 55 y.o. female.    Chief Complaint:  Chief Complaint   Patient presents with   . Annual Exam     fasting       HPI:  HPI Comments: 64 yea rold in for cpe       Problem List:  Patient Active Problem List   Diagnosis   . Right ovarian cyst   . Pain, female pelvic   . Right lower quadrant abdominal pain       Current Medications:  Current Outpatient Prescriptions   Medication Sig Dispense Refill   . acetaZOLAMIDE (DIAMOX) 125 MG tablet Take 125 mg by mouth 3 (three) times daily. Takes as needed. Last dose about 3 months ago. Takes for dizziness.       . ALPRAZolam (XANAX) 0.5 MG tablet Take 1 tablet (0.5 mg total) by mouth 2 (two) times daily as needed. 60 tablet 0   . ARMOUR THYROID 30 MG tablet TAKE 1 TABLET(30 MG) BY MOUTH DAILY 30 tablet 0   . buPROPion XL (WELLBUTRIN XL) 150 MG 24 hr tablet Take 1 tablet (150 mg total) by mouth daily. 30 tablet 0   . diazepam (VALIUM) 5 MG tablet   0   . DULoxetine (CYMBALTA) 30 MG capsule TAKE ONE CAPSULE BY MOUTH DAILY 30 capsule 0   . ergocalciferol (ERGOCALCIFEROL) 50000 UNITS capsule Take 1 capsule (50,000 Units total) by mouth once a week. 8 capsule 1   . Eszopiclone 3 MG tablet Take 1 tablet (3 mg total) by mouth nightly. Take immediately before bedtime 30 tablet 0   . HYDROcodone-acetaminophen (NORCO) 5-325 MG per tablet TK 1 T PO  Q 8 H PRF BREAKTHROUGH PAIN  0   . naproxen sodium (ANAPROX) 220 MG tablet Take 220 mg by mouth as needed.     Marland Kitchen oxyCODONE-acetaminophen (PERCOCET) 5-325 MG per tablet TK 1 T PO  Q 4 TO 6 H. LIMIT 3 TS PER DAY  0   . tiZANidine (ZANAFLEX) 4 MG tablet Take 4 mg by mouth as needed.     1   . TRANSDERM-SCOP, 1.5 MG, 1 MG/3DAYS      . valacyclovir (VALTREX) 1000 MG tablet Take 1 tablet (1,000 mg total) by mouth daily. As needed for cold sores 20 tablet 0   . verapamil (CALAN) 40 MG tablet Take 40 mg by mouth as needed. As needed for SVT       . vitamin D, ergocalciferol, (DRISDOL) 50000 UNIT Cap TAKE 1  CAPSULE BY MOUTH EVERY WEEK 8 capsule 0   . VOLTAREN 1 % Gel topical gel   0     No current facility-administered medications for this visit.       Allergies:  Allergies   Allergen Reactions   . Beef-Derived Products Anaphylaxis     And Pork products   . Erythromycin Hives   . Other Swelling and Respiratory Distress     Bee sting    . Penicillin V Hives   . Penicillins Hives       Past Medical History:  Past Medical History   Diagnosis Date   . Disorder of thyroid    . Anxiety    . Depression    . Back pain    . SVT (supraventricular tachycardia)    . Herpes    . Ovarian cyst    . Health care maintenance very nice patient  Dr Fayrene Helper husband is my patient  Colo done 2015 due 2020 Pap 2015  Mammogram 2015   . Hypothyroidism    . Calculus of kidney    . Low back pain    . Arrhythmia      Occasional SVT. (Happens rarely)    . Procreative management    . Post-operative nausea and vomiting      APFEL 3, after last laparoscopy woke up w/ SEVERE pain   . Hx of appendectomy      06/2015       Past Surgical History:  Past Surgical History   Procedure Laterality Date   . Reduction mammaplasty     . Foot surgery     . Tonsillectomy     . Colonoscopy     . Laparoscopic, operative N/A 11/22/2014     Procedure: LAPAROSCOPIC OVARIAN CYSTOTOMY;  Surgeon: Waldon Merl, MD;  Location: [facility];  Service: Gynecology;  Laterality: N/A;   . Laparoscopic, lysis, adhesions N/A 11/22/2014     Procedure: LAPAROSCOPIC, LYSIS, ADHESIONS;  Surgeon: Waldon Merl, MD;  Location: [facility];  Service: Gynecology;  Laterality: N/A;   . Laparoscopy, diagnostic N/A 07/13/2015     Procedure: LAPAROSCOPY, DIAGNOSTIC;  Surgeon: Delena Serve, MD;  Location: [facility];  Service: General;  Laterality: N/A;   . Laparoscopic, appendectomy N/A 07/13/2015     Procedure: LAPAROSCOPIC, APPENDECTOMY;  Surgeon: Delena Serve, MD;  Location: [facility];  Service: General;  Laterality: N/A;   . Laparoscopic, enterolysis N/A  07/13/2015     Procedure: LAPAROSCOPIC, LYSIS OF ADHESIONS;  Surgeon: Delena Serve, MD;  Location: [facility];  Service: General;  Laterality: N/A;   . Laparoscopic, operative N/A 07/13/2015     Procedure: LAPAROSCOPIC  SALPINGO - OOPHORECTOMY;  Surgeon: Ronalee Red, MD;  Location: [facility];  Service: Gynecology;  Laterality: N/A;       Family History:  Family History   Problem Relation Age of Onset   . Hyperlipidemia Mother    . Hyperlipidemia Father    . Skin cancer Sister    . Breast cancer Sister 9   . Diabetes Sister    . Hyperlipidemia Sister    . Heart attack Brother    . Hyperlipidemia Brother    . Breast cancer Maternal Aunt 54   . Cervical cancer Maternal Aunt    . Lung cancer Maternal Uncle    . Brain cancer Paternal Uncle    . Diabetes Maternal Grandmother    . Diabetes Paternal Grandfather    . Colon cancer Neg Hx    . Ovarian cancer Neg Hx    . Cancer Cousin        Social History:  Social History     Social History   . Marital Status: Married     Spouse Name: N/A   . Number of Children: N/A   . Years of Education: N/A     Occupational History   . Not on file.     Social History Main Topics   . Smoking status: Never Smoker    . Smokeless tobacco: Never Used   . Alcohol Use: 0.6 oz/week     1 Standard drinks or equivalent per week      Comment: rare   . Drug Use: No   . Sexual Activity:     Partners: Male     Pharmacist, hospital Protection: None     Other  Topics Concern   . Not on file     Social History Narrative       The following portions of the patient's history were reviewed and updated as appropriate: allergies, current medications, past family history, past medical history, past social history, past surgical history and problem list.    ROS:  Review of Systems   Constitutional: Negative for diaphoresis, fatigue and unexpected weight change.   HENT: Negative for congestion, dental problem, ear pain, hearing loss, rhinorrhea, sinus pressure, sneezing, sore throat and trouble swallowing.     Eyes: Negative for pain and visual disturbance.   Respiratory: Negative for apnea, cough, chest tightness and shortness of breath.    Cardiovascular: Negative for chest pain, palpitations and leg swelling.   Gastrointestinal: Negative for nausea, vomiting, abdominal pain, diarrhea, constipation and blood in stool.   Genitourinary: Negative for dysuria, urgency, frequency, hematuria, decreased urine volume, vaginal discharge, difficulty urinating, vaginal pain, pelvic pain and dyspareunia.   Musculoskeletal: Negative for myalgias and arthralgias.   Skin: Negative for color change and rash.   Neurological: Negative for dizziness, tremors, weakness and headaches.   Hematological: Negative for adenopathy.   Psychiatric/Behavioral: Negative for suicidal ideas, sleep disturbance, self-injury and agitation.       Vitals:  BP 139/84 mmHg  Pulse 99  Temp(Src) 98.2 F (36.8 C) (Oral)  Ht 1.626 m (5\' 4" )  Wt 107.956 kg (238 lb)  BMI 40.83 kg/m2  SpO2 99%    Objective:     Physical Exam:  Physical Exam   Constitutional: She is oriented to person, place, and time. She appears well-developed and well-nourished.   HENT:   Head: Normocephalic and atraumatic.   Right Ear: External ear normal.   Left Ear: External ear normal.   Nose: Nose normal.   Mouth/Throat: Oropharynx is clear and moist.   Eyes: EOM are normal. Right eye exhibits no discharge. Left eye exhibits no discharge.   Neck: Normal range of motion. Neck supple. No JVD present. No thyromegaly present.   Cardiovascular: Normal rate, regular rhythm, normal heart sounds and intact distal pulses.    No murmur heard.  Pulmonary/Chest: Effort normal and breath sounds normal.   Abdominal: Soft. Bowel sounds are normal. She exhibits no distension and no mass. There is no rebound and no guarding.   Musculoskeletal: Normal range of motion. She exhibits no edema or tenderness.   Lymphadenopathy:     She has no cervical adenopathy.   Neurological: She is oriented to person,  place, and time. She has normal reflexes.   Skin: Skin is warm and dry.   Psychiatric: She has a normal mood and affect. Her behavior is normal.   Nursing note and vitals reviewed.      Assessment:     1. Annual physical exam  - ECG 12 lead  - CBC without differential  - Comprehensive metabolic panel  - Lipid panel  - TSH  - Urinalysis  - Vitamin D,25 OH, Total  - Mammo Digital Screening Bilateral W Cad    2. Vitamin D deficiency  - CBC without differential  - Comprehensive metabolic panel  - Lipid panel  - TSH  - Urinalysis  - Vitamin D,25 OH, Total    3. High cholesterol  - CK    4. Elevated blood sugar  - Hemoglobin A1C    5. Elevated C-reactive protein (CRP)  - C Reactive Protein    6. Anxiety  - ALPRAZolam (XANAX) 0.5 MG tablet; Take 1 tablet (  0.5 mg total) by mouth 2 (two) times daily as needed.  Dispense: 60 tablet; Refill: 0    7. Primary insomnia  - Eszopiclone 3 MG tablet; Take 1 tablet (3 mg total) by mouth nightly. Take immediately before bedtime  Dispense: 30 tablet; Refill: 0      Plan:         Shanyla Marconi Angelene Giovanni, MD

## 2016-04-12 ENCOUNTER — Encounter (INDEPENDENT_AMBULATORY_CARE_PROVIDER_SITE_OTHER): Payer: Self-pay | Admitting: Internal Medicine

## 2016-04-16 ENCOUNTER — Telehealth (INDEPENDENT_AMBULATORY_CARE_PROVIDER_SITE_OTHER): Payer: Self-pay | Admitting: Internal Medicine

## 2016-04-16 NOTE — Telephone Encounter (Signed)
What was the last one given when we went over her meds last week at her physical- what is the updated med list from physical?

## 2016-04-16 NOTE — Telephone Encounter (Signed)
PT PHARMACY CALLED IN RE TO HER THYROID MEDICATION. PLEASE CLARIFY DIRECTIONS AS THEY ARE CONFUSED IF SHE IS TAKING 15 MG OR 30 MG. THANKS!

## 2016-04-16 NOTE — Telephone Encounter (Signed)
30MG  WAS REPORTED. CALLED [facility] AND INFORMED THEM

## 2016-04-21 ENCOUNTER — Other Ambulatory Visit (INDEPENDENT_AMBULATORY_CARE_PROVIDER_SITE_OTHER): Payer: Self-pay | Admitting: Internal Medicine

## 2016-05-14 ENCOUNTER — Other Ambulatory Visit (INDEPENDENT_AMBULATORY_CARE_PROVIDER_SITE_OTHER): Payer: Self-pay | Admitting: Internal Medicine

## 2016-05-18 ENCOUNTER — Other Ambulatory Visit (INDEPENDENT_AMBULATORY_CARE_PROVIDER_SITE_OTHER): Payer: Self-pay | Admitting: Family Medicine

## 2016-05-18 NOTE — Telephone Encounter (Signed)
This is ddr desai patient   Pharmacy requesting welbutrin\  JM

## 2016-05-20 ENCOUNTER — Ambulatory Visit (INDEPENDENT_AMBULATORY_CARE_PROVIDER_SITE_OTHER): Payer: BC Managed Care – PPO | Admitting: Internal Medicine

## 2016-05-20 ENCOUNTER — Encounter (INDEPENDENT_AMBULATORY_CARE_PROVIDER_SITE_OTHER): Payer: Self-pay | Admitting: Internal Medicine

## 2016-05-20 ENCOUNTER — Telehealth (INDEPENDENT_AMBULATORY_CARE_PROVIDER_SITE_OTHER): Payer: Self-pay | Admitting: Internal Medicine

## 2016-05-20 VITALS — BP 115/79 | HR 80 | Ht 64.0 in | Wt 242.0 lb

## 2016-05-20 DIAGNOSIS — F5101 Primary insomnia: Secondary | ICD-10-CM

## 2016-05-20 DIAGNOSIS — F419 Anxiety disorder, unspecified: Secondary | ICD-10-CM

## 2016-05-20 MED ORDER — ESZOPICLONE 3 MG PO TABS
3.0000 mg | ORAL_TABLET | Freq: Every evening | ORAL | Status: DC
Start: 2016-05-20 — End: 2016-08-20

## 2016-05-20 MED ORDER — ALPRAZOLAM 0.5 MG PO TABS
0.5000 mg | ORAL_TABLET | ORAL | Status: DC | PRN
Start: 2016-05-20 — End: 2016-08-20

## 2016-05-20 NOTE — Progress Notes (Signed)
Subjective:      Patient ID: Gwendolyn [name] is a 55 y.o. female.    Chief Complaint:  Chief Complaint   Patient presents with   . Insomnia     needs re-fill        HPI:  Anxiety  Presents for follow-up visit. Symptoms include excessive worry and insomnia. Patient reports no chest pain, compulsions, dizziness, irritability, malaise, nausea, nervous/anxious behavior, obsessions, palpitations, panic, restlessness, shortness of breath or suicidal ideas.           Problem List:  Patient Active Problem List   Diagnosis   . Right ovarian cyst   . Pain, female pelvic   . Right lower quadrant abdominal pain       Current Medications:  Current Outpatient Prescriptions   Medication Sig Dispense Refill   . acetaZOLAMIDE (DIAMOX) 125 MG tablet Take 125 mg by mouth 3 (three) times daily. Takes as needed. Last dose about 3 months ago. Takes for dizziness.       . ALPRAZolam (XANAX) 0.5 MG tablet Take 1 tablet (0.5 mg total) by mouth as needed. 30 tablet 0   . ARMOUR THYROID 30 MG tablet TAKE 1 TABLET BY MOUTH DAILY. 30 tablet 0   . buPROPion XL (WELLBUTRIN XL) 150 MG 24 hr tablet TAKE 1 TABLET(150 MG) BY MOUTH DAILY 30 tablet 0   . ergocalciferol (ERGOCALCIFEROL) 50000 UNITS capsule Take 1 capsule (50,000 Units total) by mouth once a week. 8 capsule 1   . Eszopiclone 3 MG tablet Take 1 tablet (3 mg total) by mouth nightly. Take immediately before bedtime 30 tablet 0   . oxyCODONE-acetaminophen (PERCOCET) 5-325 MG per tablet TK 1 T PO  Q 4 TO 6 H. LIMIT 3 TS PER DAY  0   . tiZANidine (ZANAFLEX) 4 MG tablet Take 4 mg by mouth as needed.     1   . valacyclovir (VALTREX) 1000 MG tablet Take 1 tablet (1,000 mg total) by mouth daily. As needed for cold sores 20 tablet 0   . verapamil (CALAN) 40 MG tablet Take 40 mg by mouth as needed. As needed for SVT         No current facility-administered medications for this visit.       Allergies:  Allergies   Allergen Reactions   . Beef-Derived Products Anaphylaxis     And Pork products   .  Erythromycin Hives   . Other Swelling and Respiratory Distress     Bee sting    . Penicillin V Hives   . Penicillins Hives       Past Medical History:  Past Medical History   Diagnosis Date   . Disorder of thyroid    . Anxiety    . Depression    . Back pain    . SVT (supraventricular tachycardia)    . Herpes    . Ovarian cyst    . Health care maintenance very nice patient      Dr Fayrene Helper husband is my patient  Colo done 2015 due 2020 Pap 2015  Mammogram 2015   . Hypothyroidism    . Calculus of kidney    . Low back pain    . Arrhythmia      Occasional SVT. (Happens rarely)    . Procreative management    . Post-operative nausea and vomiting      APFEL 3, after last laparoscopy woke up w/ SEVERE pain   . Hx of appendectomy  06/2015       Past Surgical History:  Past Surgical History   Procedure Laterality Date   . Reduction mammaplasty     . Foot surgery     . Tonsillectomy     . Colonoscopy     . Laparoscopic, operative N/A 11/22/2014     Procedure: LAPAROSCOPIC OVARIAN CYSTOTOMY;  Surgeon: Waldon Merl, MD;  Location: [facility];  Service: Gynecology;  Laterality: N/A;   . Laparoscopic, lysis, adhesions N/A 11/22/2014     Procedure: LAPAROSCOPIC, LYSIS, ADHESIONS;  Surgeon: Waldon Merl, MD;  Location: [facility];  Service: Gynecology;  Laterality: N/A;   . Laparoscopy, diagnostic N/A 07/13/2015     Procedure: LAPAROSCOPY, DIAGNOSTIC;  Surgeon: Delena Serve, MD;  Location: [facility];  Service: General;  Laterality: N/A;   . Laparoscopic, appendectomy N/A 07/13/2015     Procedure: LAPAROSCOPIC, APPENDECTOMY;  Surgeon: Delena Serve, MD;  Location: [facility];  Service: General;  Laterality: N/A;   . Laparoscopic, enterolysis N/A 07/13/2015     Procedure: LAPAROSCOPIC, LYSIS OF ADHESIONS;  Surgeon: Delena Serve, MD;  Location: [facility];  Service: General;  Laterality: N/A;   . Laparoscopic, operative N/A 07/13/2015     Procedure: LAPAROSCOPIC  SALPINGO - OOPHORECTOMY;   Surgeon: Ronalee Red, MD;  Location: [facility];  Service: Gynecology;  Laterality: N/A;       Family History:  Family History   Problem Relation Age of Onset   . Hyperlipidemia Mother    . Hyperlipidemia Father    . Skin cancer Sister    . Breast cancer Sister 43   . Diabetes Sister    . Hyperlipidemia Sister    . Heart attack Brother    . Hyperlipidemia Brother    . Breast cancer Maternal Aunt 54   . Cervical cancer Maternal Aunt    . Lung cancer Maternal Uncle    . Brain cancer Paternal Uncle    . Diabetes Maternal Grandmother    . Diabetes Paternal Grandfather    . Colon cancer Neg Hx    . Ovarian cancer Neg Hx    . Cancer Cousin        Social History:  Social History     Social History   . Marital Status: Married     Spouse Name: N/A   . Number of Children: N/A   . Years of Education: N/A     Occupational History   . Not on file.     Social History Main Topics   . Smoking status: Never Smoker    . Smokeless tobacco: Never Used   . Alcohol Use: 0.6 oz/week     1 Standard drinks or equivalent per week      Comment: rare   . Drug Use: No   . Sexual Activity:     Partners: Male     Birth Control/ Protection: None     Other Topics Concern   . Not on file     Social History Narrative       The following portions of the patient's history were reviewed and updated as appropriate: allergies, current medications, past family history, past medical history, past social history, past surgical history and problem list.    ROS:  Review of Systems   Constitutional: Negative for diaphoresis, irritability, fatigue and unexpected weight change.   HENT: Negative for congestion, dental problem, ear pain, hearing loss, rhinorrhea, sinus pressure, sneezing, sore  throat and trouble swallowing.    Eyes: Negative for pain and visual disturbance.   Respiratory: Negative for apnea, cough, chest tightness and shortness of breath.    Cardiovascular: Negative for chest pain, palpitations and leg swelling.   Gastrointestinal: Negative  for nausea, vomiting, abdominal pain, diarrhea, constipation and blood in stool.   Genitourinary: Negative for dysuria, urgency, frequency, hematuria, decreased urine volume, vaginal discharge, difficulty urinating, vaginal pain, pelvic pain and dyspareunia.   Musculoskeletal: Negative for myalgias and arthralgias.   Skin: Negative for color change and rash.   Neurological: Negative for dizziness, tremors, weakness and headaches.   Hematological: Negative for adenopathy.   Psychiatric/Behavioral: Negative for suicidal ideas, sleep disturbance, self-injury and agitation. The patient has insomnia. The patient is not nervous/anxious.        Vitals:  BP 115/79 mmHg  Pulse 80  Ht 1.626 m (5\' 4" )  Wt 109.77 kg (242 lb)  BMI 41.52 kg/m2  SpO2 99%    Objective:     Physical Exam:  Physical Exam   Constitutional: She is oriented to person, place, and time. She appears well-developed and well-nourished.   HENT:   Head: Normocephalic and atraumatic.   Right Ear: External ear normal.   Left Ear: External ear normal.   Nose: Nose normal.   Mouth/Throat: Oropharynx is clear and moist.   Eyes: EOM are normal. Right eye exhibits no discharge. Left eye exhibits no discharge.   Neck: Normal range of motion. Neck supple. No JVD present. No thyromegaly present.   Cardiovascular: Normal rate, regular rhythm, normal heart sounds and intact distal pulses.    No murmur heard.  Pulmonary/Chest: Effort normal and breath sounds normal.   Abdominal: Soft. Bowel sounds are normal. She exhibits no distension and no mass. There is no rebound and no guarding.   Musculoskeletal: Normal range of motion. She exhibits no edema or tenderness.   Lymphadenopathy:     She has no cervical adenopathy.   Neurological: She is oriented to person, place, and time. She has normal reflexes.   Skin: Skin is warm and dry.   Psychiatric: She has a normal mood and affect. Her behavior is normal.   Nursing note and vitals reviewed.      Assessment:     1.  Anxiety  - ALPRAZolam (XANAX) 0.5 MG tablet; Take 1 tablet (0.5 mg total) by mouth as needed.  Dispense: 30 tablet; Refill: 0    2. Primary insomnia  - Eszopiclone 3 MG tablet; Take 1 tablet (3 mg total) by mouth nightly. Take immediately before bedtime  Dispense: 30 tablet; Refill: 0      Plan:     Risk & Benefits of the new medication(s) were explained to the pt (and family) who appeared to understand & agree to the treatment plan.    Aurilla Coulibaly Angelene Giovanni, MD

## 2016-05-20 NOTE — Telephone Encounter (Signed)
Pharmacy called to get clarification on the instructions sent with ALPRAZolam Prudy Feeler) 0.5 MG tablet's prescription. N.on file for pharmacy is correct. Pls call her back.

## 2016-05-21 NOTE — Telephone Encounter (Signed)
Called pharmacy on file in re to the msg they left. I clarified pt directions on her Xanax script. Pt is to take 1 tablet (0.5 mg total) by mouth as needed.

## 2016-05-24 ENCOUNTER — Encounter (INDEPENDENT_AMBULATORY_CARE_PROVIDER_SITE_OTHER): Payer: Self-pay | Admitting: Internal Medicine

## 2016-06-07 ENCOUNTER — Other Ambulatory Visit (INDEPENDENT_AMBULATORY_CARE_PROVIDER_SITE_OTHER): Payer: Self-pay | Admitting: Internal Medicine

## 2016-06-11 ENCOUNTER — Encounter (INDEPENDENT_AMBULATORY_CARE_PROVIDER_SITE_OTHER): Payer: Self-pay | Admitting: Internal Medicine

## 2016-06-11 ENCOUNTER — Other Ambulatory Visit (INDEPENDENT_AMBULATORY_CARE_PROVIDER_SITE_OTHER): Payer: Self-pay | Admitting: Internal Medicine

## 2016-06-23 ENCOUNTER — Other Ambulatory Visit (INDEPENDENT_AMBULATORY_CARE_PROVIDER_SITE_OTHER): Payer: Self-pay | Admitting: Internal Medicine

## 2016-06-28 ENCOUNTER — Other Ambulatory Visit (INDEPENDENT_AMBULATORY_CARE_PROVIDER_SITE_OTHER): Payer: Self-pay | Admitting: Internal Medicine

## 2016-06-28 MED ORDER — THYROID 15 MG PO TABS
15.0000 mg | ORAL_TABLET | Freq: Every day | ORAL | Status: DC
Start: 2016-06-28 — End: 2016-10-22

## 2016-06-29 ENCOUNTER — Other Ambulatory Visit (INDEPENDENT_AMBULATORY_CARE_PROVIDER_SITE_OTHER): Payer: Self-pay | Admitting: Internal Medicine

## 2016-07-13 ENCOUNTER — Other Ambulatory Visit (INDEPENDENT_AMBULATORY_CARE_PROVIDER_SITE_OTHER): Payer: Self-pay | Admitting: Internal Medicine

## 2016-07-31 ENCOUNTER — Other Ambulatory Visit (INDEPENDENT_AMBULATORY_CARE_PROVIDER_SITE_OTHER): Payer: Self-pay | Admitting: Internal Medicine

## 2016-08-13 ENCOUNTER — Other Ambulatory Visit (INDEPENDENT_AMBULATORY_CARE_PROVIDER_SITE_OTHER): Payer: Self-pay | Admitting: Internal Medicine

## 2016-08-20 ENCOUNTER — Ambulatory Visit (INDEPENDENT_AMBULATORY_CARE_PROVIDER_SITE_OTHER): Payer: BLUE CROSS/BLUE SHIELD | Admitting: Internal Medicine

## 2016-08-20 ENCOUNTER — Encounter (INDEPENDENT_AMBULATORY_CARE_PROVIDER_SITE_OTHER): Payer: Self-pay | Admitting: Internal Medicine

## 2016-08-20 ENCOUNTER — Other Ambulatory Visit (INDEPENDENT_AMBULATORY_CARE_PROVIDER_SITE_OTHER): Payer: Self-pay | Admitting: Internal Medicine

## 2016-08-20 DIAGNOSIS — F5101 Primary insomnia: Secondary | ICD-10-CM

## 2016-08-20 DIAGNOSIS — F419 Anxiety disorder, unspecified: Secondary | ICD-10-CM

## 2016-08-20 DIAGNOSIS — E559 Vitamin D deficiency, unspecified: Secondary | ICD-10-CM

## 2016-08-20 MED ORDER — VALACYCLOVIR HCL 1 G PO TABS
1000.0000 mg | ORAL_TABLET | Freq: Every day | ORAL | 0 refills | Status: DC
Start: 2016-08-20 — End: 2016-10-21

## 2016-08-20 MED ORDER — ALPRAZOLAM 0.5 MG PO TABS
0.5000 mg | ORAL_TABLET | ORAL | 0 refills | Status: DC | PRN
Start: 2016-08-20 — End: 2016-10-22

## 2016-08-20 MED ORDER — ESZOPICLONE 3 MG PO TABS
3.0000 mg | ORAL_TABLET | Freq: Every evening | ORAL | 0 refills | Status: DC
Start: 2016-08-20 — End: 2017-09-01

## 2016-08-20 MED ORDER — CIPROFLOXACIN HCL 500 MG PO TABS
500.0000 mg | ORAL_TABLET | Freq: Two times a day (BID) | ORAL | 0 refills | Status: AC
Start: 2016-08-20 — End: 2016-08-30

## 2016-08-20 MED ORDER — ERGOCALCIFEROL 1.25 MG (50000 UT) PO CAPS
50000.0000 [IU] | ORAL_CAPSULE | ORAL | 1 refills | Status: DC
Start: 2016-08-20 — End: 2017-01-07

## 2016-08-20 MED ORDER — PREDNISONE 20 MG PO TABS
ORAL_TABLET | ORAL | 0 refills | Status: DC
Start: 2016-08-20 — End: 2016-10-22

## 2016-08-20 NOTE — Telephone Encounter (Signed)
msg sent to pt to make an appt

## 2016-08-20 NOTE — Progress Notes (Signed)
Subjective:      Patient ID: Gwendolyn [name] is a 55 y.o. female.    Chief Complaint:  Chief Complaint   Patient presents with   . Sinusitis   . Anxiety       HPI:  Sinusitis   This is a new problem. The current episode started in the past 7 days. Pertinent negatives include no congestion, coughing, ear pain, headaches, shortness of breath, sinus pressure or sore throat.   Anxiety   Presents for follow-up visit. Patient reports no chest pain, compulsions, decreased concentration, depressed mood, dizziness, excessive worry, impotence, irritability, muscle tension, nausea, nervous/anxious behavior, obsessions, palpitations, shortness of breath or suicidal ideas.           Problem List:  Patient Active Problem List   Diagnosis   . Right ovarian cyst   . Pain, female pelvic   . Right lower quadrant abdominal pain       Current Medications:  Current Outpatient Prescriptions   Medication Sig Dispense Refill   . acetaZOLAMIDE (DIAMOX) 125 MG tablet Take 125 mg by mouth 3 (three) times daily. Takes as needed. Last dose about 3 months ago. Takes for dizziness.       . ALPRAZolam (XANAX) 0.5 MG tablet Take 1 tablet (0.5 mg total) by mouth as needed for Anxiety. 30 tablet 0   . ARMOUR THYROID 30 MG tablet TAKE 1 TABLET BY MOUTH DAILY. 30 tablet 0   . buPROPion XL (WELLBUTRIN XL) 150 MG 24 hr tablet TAKE 1 TABLET(150 MG) BY MOUTH DAILY 30 tablet 0   . buPROPion XL (WELLBUTRIN XL) 150 MG 24 hr tablet TAKE 1 TABLET(150 MG) BY MOUTH DAILY 30 tablet 0   . ergocalciferol (ERGOCALCIFEROL) 50000 units capsule Take 1 capsule (50,000 Units total) by mouth once a week. 8 capsule 1   . Eszopiclone 3 MG tablet Take 1 tablet (3 mg total) by mouth nightly.Take immediately before bedtime 30 tablet 0   . oxyCODONE-acetaminophen (PERCOCET) 5-325 MG per tablet TK 1 T PO  Q 4 TO 6 H. LIMIT 3 TS PER DAY  0   . thyroid (ARMOUR THYROID) 15 MG tablet Take 1 tablet (15 mg total) by mouth daily. 30 tablet 2   . tiZANidine (ZANAFLEX) 4 MG tablet Take 4 mg  by mouth as needed.     1   . valacyclovir (VALTREX) 1000 MG tablet Take 1 tablet (1,000 mg total) by mouth daily.As needed for cold sores 20 tablet 0   . verapamil (CALAN) 40 MG tablet Take 40 mg by mouth as needed. As needed for SVT         No current facility-administered medications for this visit.        Allergies:  Allergies   Allergen Reactions   . Beef-Derived Products Anaphylaxis     And Pork products   . Erythromycin Hives   . Other Swelling and Respiratory Distress     Bee sting    . Penicillin V Hives   . Penicillins Hives       Past Medical History:  Past Medical History:   Diagnosis Date   . Anxiety    . Arrhythmia     Occasional SVT. (Happens rarely)    . Back pain    . Calculus of kidney    . Depression    . Disorder of thyroid    . Health care maintenance very nice patient     Dr Fayrene Helper husband is my patient  Colo  done 2015 due 2020 Pap 2015  Mammogram 2015   . Herpes    . Hx of appendectomy     06/2015   . Hypothyroidism    . Low back pain    . Ovarian cyst    . Post-operative nausea and vomiting     APFEL 3, after last laparoscopy woke up w/ SEVERE pain   . Procreative management    . SVT (supraventricular tachycardia)        Past Surgical History:  Past Surgical History:   Procedure Laterality Date   . COLONOSCOPY     . FOOT SURGERY     . LAPAROSCOPIC, APPENDECTOMY N/A 07/13/2015    Procedure: LAPAROSCOPIC, APPENDECTOMY;  Surgeon: Delena Serve, MD;  Location: [facility];  Service: General;  Laterality: N/A;   . LAPAROSCOPIC, ENTEROLYSIS N/A 07/13/2015    Procedure: LAPAROSCOPIC, LYSIS OF ADHESIONS;  Surgeon: Delena Serve, MD;  Location: [facility];  Service: General;  Laterality: N/A;   . LAPAROSCOPIC, LYSIS, ADHESIONS N/A 11/22/2014    Procedure: LAPAROSCOPIC, LYSIS, ADHESIONS;  Surgeon: Waldon Merl, MD;  Location: [facility];  Service: Gynecology;  Laterality: N/A;   . LAPAROSCOPIC, OPERATIVE N/A 11/22/2014    Procedure: LAPAROSCOPIC OVARIAN CYSTOTOMY;  Surgeon:  Waldon Merl, MD;  Location: [facility];  Service: Gynecology;  Laterality: N/A;   . LAPAROSCOPIC, OPERATIVE N/A 07/13/2015    Procedure: LAPAROSCOPIC  SALPINGO - OOPHORECTOMY;  Surgeon: Ronalee Red, MD;  Location: [facility];  Service: Gynecology;  Laterality: N/A;   . LAPAROSCOPY, DIAGNOSTIC N/A 07/13/2015    Procedure: LAPAROSCOPY, DIAGNOSTIC;  Surgeon: Delena Serve, MD;  Location: [facility];  Service: General;  Laterality: N/A;   . REDUCTION MAMMAPLASTY     . TONSILLECTOMY         Family History:  Family History   Problem Relation Age of Onset   . Hyperlipidemia Mother    . Hyperlipidemia Father    . Skin cancer Sister    . Breast cancer Sister 15   . Diabetes Sister    . Hyperlipidemia Sister    . Heart attack Brother    . Hyperlipidemia Brother    . Breast cancer Maternal Aunt 54   . Cervical cancer Maternal Aunt    . Lung cancer Maternal Uncle    . Brain cancer Paternal Uncle    . Diabetes Maternal Grandmother    . Diabetes Paternal Grandfather    . Cancer Cousin    . Colon cancer Neg Hx    . Ovarian cancer Neg Hx        Social History:  Social History     Social History   . Marital status: Married     Spouse name: N/A   . Number of children: N/A   . Years of education: N/A     Occupational History   . Not on file.     Social History Main Topics   . Smoking status: Never Smoker   . Smokeless tobacco: Never Used   . Alcohol use 0.6 oz/week     1 Standard drinks or equivalent per week      Comment: rare   . Drug use: No   . Sexual activity: Yes     Partners: Male     Birth control/ protection: None     Other Topics Concern   . Not on file     Social History Narrative   . No  narrative on file       The following portions of the patient's history were reviewed and updated as appropriate: allergies, current medications, past family history, past medical history, past social history, past surgical history and problem list.    ROS:  Review of Systems   Constitutional: Negative for activity  change, appetite change, fatigue, irritability and unexpected weight change.   HENT: Negative for congestion, ear pain, hearing loss, postnasal drip, sinus pressure and sore throat.    Eyes: Negative for pain and visual disturbance.   Respiratory: Negative for cough, chest tightness, shortness of breath and wheezing.    Cardiovascular: Negative for chest pain and palpitations.   Gastrointestinal: Negative for abdominal pain, blood in stool, constipation, diarrhea and nausea.   Genitourinary: Negative for decreased urine volume, difficulty urinating, dyspareunia, frequency, hematuria, impotence, menstrual problem, pelvic pain, vaginal bleeding, vaginal discharge and vaginal pain.   Musculoskeletal: Negative for arthralgias and myalgias.   Skin: Negative for color change and rash.   Neurological: Negative for dizziness, weakness, numbness and headaches.   Hematological: Negative for adenopathy.   Psychiatric/Behavioral: Negative for agitation, behavioral problems, decreased concentration, self-injury, sleep disturbance and suicidal ideas. The patient is not nervous/anxious.        Vitals:  BP 124/76   Pulse 66   Temp 98.5 F (36.9 C) (Oral)   Ht 1.626 m (5\' 4" )   Wt 106.6 kg (235 lb)   SpO2 96%   BMI 40.34 kg/m     Objective:     Physical Exam:  Physical Exam   Constitutional: She is oriented to person, place, and time. She appears well-developed and well-nourished.   HENT:   Head: Normocephalic and atraumatic.   Right Ear: External ear normal.   Left Ear: External ear normal.   Nose: Nose normal.   Mouth/Throat: Oropharynx is clear and moist.   Eyes: EOM are normal. Right eye exhibits no discharge. Left eye exhibits no discharge.   Neck: Normal range of motion. Neck supple. No JVD present. No thyromegaly present.   Cardiovascular: Normal rate, regular rhythm, normal heart sounds and intact distal pulses.    No murmur heard.  Pulmonary/Chest: Effort normal and breath sounds normal.   Abdominal: Soft. Bowel  sounds are normal. She exhibits no distension and no mass. There is no rebound and no guarding.   Musculoskeletal: Normal range of motion. She exhibits no edema or tenderness.   Lymphadenopathy:     She has no cervical adenopathy.   Neurological: She is oriented to person, place, and time. She has normal reflexes.   Skin: Skin is warm and dry.   Psychiatric: She has a normal mood and affect. Her behavior is normal.   Nursing note and vitals reviewed.      Assessment:     1. Anxiety  - ALPRAZolam (XANAX) 0.5 MG tablet; Take 1 tablet (0.5 mg total) by mouth as needed for Anxiety.  Dispense: 30 tablet; Refill: 0    2. Primary insomnia  - Eszopiclone 3 MG tablet; Take 1 tablet (3 mg total) by mouth nightly.Take immediately before bedtime  Dispense: 30 tablet; Refill: 0    3. Vitamin D deficiency  - ergocalciferol (ERGOCALCIFEROL) 50000 units capsule; Take 1 capsule (50,000 Units total) by mouth once a week.  Dispense: 8 capsule; Refill: 1    4. Sinus infection  rx amoxicillin    Plan:         Freeda Spivey Angelene Giovanni, MD

## 2016-08-29 ENCOUNTER — Other Ambulatory Visit (INDEPENDENT_AMBULATORY_CARE_PROVIDER_SITE_OTHER): Payer: Self-pay | Admitting: Internal Medicine

## 2016-09-06 ENCOUNTER — Encounter (INDEPENDENT_AMBULATORY_CARE_PROVIDER_SITE_OTHER): Payer: Self-pay | Admitting: Internal Medicine

## 2016-09-11 ENCOUNTER — Other Ambulatory Visit (INDEPENDENT_AMBULATORY_CARE_PROVIDER_SITE_OTHER): Payer: Self-pay | Admitting: Internal Medicine

## 2016-09-16 ENCOUNTER — Encounter (INDEPENDENT_AMBULATORY_CARE_PROVIDER_SITE_OTHER): Payer: Self-pay | Admitting: Internal Medicine

## 2016-09-18 ENCOUNTER — Encounter (INDEPENDENT_AMBULATORY_CARE_PROVIDER_SITE_OTHER): Payer: Self-pay | Admitting: Internal Medicine

## 2016-09-24 ENCOUNTER — Encounter (INDEPENDENT_AMBULATORY_CARE_PROVIDER_SITE_OTHER): Payer: Self-pay | Admitting: Internal Medicine

## 2016-09-26 ENCOUNTER — Other Ambulatory Visit (INDEPENDENT_AMBULATORY_CARE_PROVIDER_SITE_OTHER): Payer: Self-pay | Admitting: Internal Medicine

## 2016-10-08 ENCOUNTER — Other Ambulatory Visit (INDEPENDENT_AMBULATORY_CARE_PROVIDER_SITE_OTHER): Payer: Self-pay | Admitting: Internal Medicine

## 2016-10-14 ENCOUNTER — Encounter (INDEPENDENT_AMBULATORY_CARE_PROVIDER_SITE_OTHER): Payer: Self-pay | Admitting: Internal Medicine

## 2016-10-21 ENCOUNTER — Other Ambulatory Visit (INDEPENDENT_AMBULATORY_CARE_PROVIDER_SITE_OTHER): Payer: Self-pay | Admitting: Internal Medicine

## 2016-10-22 ENCOUNTER — Ambulatory Visit (INDEPENDENT_AMBULATORY_CARE_PROVIDER_SITE_OTHER): Payer: BLUE CROSS/BLUE SHIELD | Admitting: Internal Medicine

## 2016-10-22 ENCOUNTER — Encounter (INDEPENDENT_AMBULATORY_CARE_PROVIDER_SITE_OTHER): Payer: Self-pay | Admitting: Internal Medicine

## 2016-10-22 VITALS — BP 129/85 | HR 83 | Temp 98.6°F | Resp 12 | Ht 64.02 in | Wt 230.0 lb

## 2016-10-22 DIAGNOSIS — E669 Obesity, unspecified: Secondary | ICD-10-CM | POA: Insufficient documentation

## 2016-10-22 DIAGNOSIS — Z8619 Personal history of other infectious and parasitic diseases: Secondary | ICD-10-CM

## 2016-10-22 DIAGNOSIS — F419 Anxiety disorder, unspecified: Secondary | ICD-10-CM

## 2016-10-22 MED ORDER — VALACYCLOVIR HCL 1 G PO TABS
2000.0000 mg | ORAL_TABLET | Freq: Two times a day (BID) | ORAL | 3 refills | Status: DC
Start: 2016-10-22 — End: 2017-05-25

## 2016-10-22 MED ORDER — ALPRAZOLAM 0.5 MG PO TABS
0.5000 mg | ORAL_TABLET | ORAL | 0 refills | Status: DC | PRN
Start: 2016-10-22 — End: 2017-05-13

## 2016-10-22 NOTE — Progress Notes (Signed)
Chief Complaint   Patient presents with   . Medication Refill     Valtrex       HPI    Gwendolyn [name] is a 55 y.o. female pt of Dr Celine Mans who I am seeing for the first time here for evaluation and treatment of the following concerns.     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  She has taken Valtrex in the past.  She requests a refill of Valtrex at the previous dose.  She does not take a dose for suppression and normally takes when she gets cold sores.     Patient also has a history of anxiety treated in the past from her primary care physician with Xanax.  She requests a refill of the medication at the current dose.  She denies any worsening stress or anxiety but has had some changes related to a new job.  She last got a refill of 30 tablets in late September.  She denies any drug or alcohol abuse.     Active Problems  Patient Active Problem List    Diagnosis Date Noted   . Obesity (BMI 30-39.9) 10/22/2016   . Right ovarian cyst 11/22/2014   . Pain, female pelvic 11/22/2014       The patient's past medical history, surgical history, medications and allergies were reviewed.    Allergies   Allergen Reactions   . Beef-Derived Products Anaphylaxis     And Pork products   . Erythromycin Hives   . Other Swelling and Respiratory Distress     Bee sting    . Penicillin V Hives   . Penicillins Hives       Medications  Current Outpatient Prescriptions   Medication Sig Dispense Refill   . ALPRAZolam (XANAX) 0.5 MG tablet Take 1 tablet (0.5 mg total) by mouth as needed for Anxiety. 30 tablet 0   . ARMOUR THYROID 30 MG tablet TAKE 1 TABLET BY MOUTH DAILY 30 tablet 0   . buPROPion XL (WELLBUTRIN XL) 150 MG 24 hr tablet TAKE 1 TABLET(150 MG) BY MOUTH DAILY 30 tablet 0   . DUEXIS 800-26.6 MG Tab      . ergocalciferol (ERGOCALCIFEROL) 50000 units capsule Take 1 capsule (50,000 Units total) by mouth once a week. 8 capsule 1   . Eszopiclone 3 MG tablet Take 1 tablet (3 mg total) by mouth  nightly.Take immediately before bedtime 30 tablet 0   . oxyCODONE-acetaminophen (PERCOCET) 5-325 MG per tablet TK 1 T PO  Q 4 TO 6 H. LIMIT 3 TS PER DAY  0   . tiZANidine (ZANAFLEX) 4 MG tablet Take 4 mg by mouth as needed.     1   . valacyclovir (VALTREX) 1000 MG tablet Take 2 tablets (2,000 mg total) by mouth 2 (two) times daily. 20 tablet 3   . acetaZOLAMIDE (DIAMOX) 125 MG tablet Take 125 mg by mouth 3 (three) times daily. Takes as needed. Last dose about 3 months ago. Takes for dizziness.       . verapamil (CALAN) 40 MG tablet Take 40 mg by mouth as needed. As needed for SVT         No current facility-administered medications for this visit.        Review of Systems  CONST: 5 lb WL no fevers/chills sweats, fatigue, muscle aches  PSYCH:  No depressed mood, anhedonia, anxiety    Physical Exam  BP 129/85 (BP  Site: Left arm, Patient Position: Sitting, Cuff Size: Medium)   Pulse 83   Temp 98.6 F (37 C) (Oral)   Resp 12   Ht 1.626 m (5' 4.02")   Wt 104.3 kg (230 lb)   SpO2 99%   BMI 39.46 kg/m   Wt Readings from Last 3 Encounters:   10/22/16 104.3 kg (230 lb)   08/20/16 106.6 kg (235 lb)   05/20/16 109.8 kg (242 lb)     GEN: Obese cauc female alert and appropriate in no apparent distress  PSYCH: normal mood and affect  SKIN: HSV lesion on upper lip    Assessment/Plan    1. History of cold sores  Refill of medication was granted.     - valacyclovir (VALTREX) 1000 MG tablet; Take 2 tablets (2,000 mg total) by mouth 2 (two) times daily.  Dispense: 20 tablet; Refill: 3    2. Anxiety  Refill of medication was granted.     - ALPRAZolam (XANAX) 0.5 MG tablet; Take 1 tablet (0.5 mg total) by mouth as needed for Anxiety.  Dispense: 30 tablet; Refill: 0    Risks & benefits of the new medication(s) were explained to the patient, who appeared to understand and agrees to the treatment plan.    Gloriajean Dell, MD  [facility]  [address], [address]  [place], [place] [address]  [phone]  F:  [phone]

## 2016-10-23 ENCOUNTER — Other Ambulatory Visit (INDEPENDENT_AMBULATORY_CARE_PROVIDER_SITE_OTHER): Payer: Self-pay | Admitting: Internal Medicine

## 2016-11-04 ENCOUNTER — Other Ambulatory Visit (INDEPENDENT_AMBULATORY_CARE_PROVIDER_SITE_OTHER): Payer: Self-pay | Admitting: Internal Medicine

## 2016-11-21 ENCOUNTER — Other Ambulatory Visit (INDEPENDENT_AMBULATORY_CARE_PROVIDER_SITE_OTHER): Payer: Self-pay | Admitting: Internal Medicine

## 2016-12-03 ENCOUNTER — Other Ambulatory Visit (INDEPENDENT_AMBULATORY_CARE_PROVIDER_SITE_OTHER): Payer: Self-pay | Admitting: Internal Medicine

## 2016-12-03 DIAGNOSIS — E559 Vitamin D deficiency, unspecified: Secondary | ICD-10-CM

## 2016-12-10 ENCOUNTER — Telehealth (INDEPENDENT_AMBULATORY_CARE_PROVIDER_SITE_OTHER): Payer: Self-pay | Admitting: Internal Medicine

## 2016-12-10 NOTE — Telephone Encounter (Signed)
*  Dr. Humphrey Rolls patient*    Patient needs a refill on    ergocalciferol (ERGOCALCIFEROL) 50000 units capsule    Pharmacy on file

## 2016-12-18 ENCOUNTER — Other Ambulatory Visit (INDEPENDENT_AMBULATORY_CARE_PROVIDER_SITE_OTHER): Payer: Self-pay | Admitting: Internal Medicine

## 2016-12-18 NOTE — Telephone Encounter (Signed)
Sent to dr desai

## 2016-12-25 ENCOUNTER — Other Ambulatory Visit (INDEPENDENT_AMBULATORY_CARE_PROVIDER_SITE_OTHER): Payer: Self-pay | Admitting: Internal Medicine

## 2016-12-25 DIAGNOSIS — R519 Headache, unspecified: Secondary | ICD-10-CM

## 2016-12-25 MED ORDER — SUMATRIPTAN SUCCINATE 25 MG PO TABS
25.0000 mg | ORAL_TABLET | ORAL | 0 refills | Status: DC | PRN
Start: 2016-12-25 — End: 2017-05-08

## 2016-12-27 ENCOUNTER — Ambulatory Visit (INDEPENDENT_AMBULATORY_CARE_PROVIDER_SITE_OTHER): Payer: BLUE CROSS/BLUE SHIELD | Admitting: Internal Medicine

## 2016-12-27 ENCOUNTER — Encounter (INDEPENDENT_AMBULATORY_CARE_PROVIDER_SITE_OTHER): Payer: Self-pay | Admitting: Internal Medicine

## 2016-12-27 VITALS — BP 114/77 | HR 81 | Temp 97.8°F | Ht 64.0 in | Wt 227.0 lb

## 2016-12-27 DIAGNOSIS — R739 Hyperglycemia, unspecified: Secondary | ICD-10-CM

## 2016-12-27 DIAGNOSIS — R51 Headache: Secondary | ICD-10-CM

## 2016-12-27 DIAGNOSIS — E7800 Pure hypercholesterolemia, unspecified: Secondary | ICD-10-CM

## 2016-12-27 DIAGNOSIS — R519 Headache, unspecified: Secondary | ICD-10-CM

## 2016-12-27 LAB — COMPREHENSIVE METABOLIC PANEL
ALT: 28 U/L (ref 0–55)
AST (SGOT): 22 U/L (ref 5–34)
Albumin/Globulin Ratio: 1.3 (ref 0.9–2.2)
Albumin: 3.8 g/dL (ref 3.5–5.0)
Alkaline Phosphatase: 136 U/L — ABNORMAL HIGH (ref 37–106)
BUN: 14 mg/dL (ref 7.0–19.0)
Bilirubin, Total: 0.4 mg/dL (ref 0.1–1.2)
CO2: 26 mEq/L (ref 21–29)
Calcium: 9.8 mg/dL (ref 8.5–10.5)
Chloride: 108 mEq/L (ref 100–111)
Creatinine: 0.9 mg/dL (ref 0.4–1.5)
Globulin: 3 g/dL (ref 2.0–3.7)
Glucose: 91 mg/dL (ref 70–100)
Potassium: 4 mEq/L (ref 3.5–5.1)
Protein, Total: 6.8 g/dL (ref 6.0–8.3)
Sodium: 145 mEq/L (ref 136–145)

## 2016-12-27 LAB — LIPID PANEL
Cholesterol / HDL Ratio: 5.1
Cholesterol: 253 mg/dL — ABNORMAL HIGH (ref 0–199)
HDL: 50 mg/dL (ref 40–9999)
LDL Calculated: 177 mg/dL — ABNORMAL HIGH (ref 0–99)
Triglycerides: 128 mg/dL (ref 34–149)
VLDL Calculated: 26 mg/dL (ref 10–40)

## 2016-12-27 LAB — HEMOGLOBIN A1C
Average Estimated Glucose: 114 mg/dL
Hemoglobin A1C: 5.6 % (ref 4.6–5.9)

## 2016-12-27 LAB — HEMOLYSIS INDEX: Hemolysis Index: 5 (ref 0–18)

## 2016-12-27 LAB — GFR: EGFR: >60

## 2016-12-27 MED ORDER — PREDNISONE 10 MG PO TABS
ORAL_TABLET | ORAL | 0 refills | Status: DC
Start: 2016-12-27 — End: 2017-05-08

## 2016-12-27 NOTE — Progress Notes (Signed)
Subjective:      Patient ID: Gwendolyn [name] is a 56 y.o. female.    Chief Complaint:  Chief Complaint   Patient presents with   . Headache       HPI:  Headache    This is a new problem. The current episode started 1 to 4 weeks ago. The problem occurs daily. The problem has been gradually worsening. The pain is located in the bilateral region. The pain does not radiate. The pain quality is not similar to prior headaches. The quality of the pain is described as aching. Pertinent negatives include no abdominal pain, blurred vision, dizziness, facial sweating, insomnia, loss of balance, neck pain, numbness, phonophobia, seizures, tingling, visual change, vomiting or weakness. Nothing aggravates the symptoms. She has tried ergotamines for the symptoms. There is no history of cluster headaches, migraine headaches, migraines in the family or pseudotumor cerebri.       Problem List:  Patient Active Problem List   Diagnosis   . Right ovarian cyst   . Pain, female pelvic   . Obesity (BMI 30-39.9)       Current Medications:  Current Outpatient Prescriptions   Medication Sig Dispense Refill   . acetaZOLAMIDE (DIAMOX) 125 MG tablet Take 125 mg by mouth 3 (three) times daily. Takes as needed. Last dose about 3 months ago. Takes for dizziness.       . ALPRAZolam (XANAX) 0.5 MG tablet Take 1 tablet (0.5 mg total) by mouth as needed for Anxiety. 30 tablet 0   . ARMOUR THYROID 30 MG tablet TAKE 1 TABLET BY MOUTH DAILY 30 tablet 0   . buPROPion XL (WELLBUTRIN XL) 150 MG 24 hr tablet TAKE 1 TABLET BY MOUTH DAILY 30 tablet 0   . DUEXIS 800-26.6 MG Tab      . ergocalciferol (ERGOCALCIFEROL) 50000 units capsule Take 1 capsule (50,000 Units total) by mouth once a week. 8 capsule 1   . Eszopiclone 3 MG tablet Take 1 tablet (3 mg total) by mouth nightly.Take immediately before bedtime 30 tablet 0   . oxyCODONE-acetaminophen (PERCOCET) 5-325 MG per tablet TK 1 T PO  Q 4 TO 6 H. LIMIT 3 TS PER DAY  0   . SUMAtriptan (IMITREX) 25 MG tablet Take 1  tablet (25 mg total) by mouth every 2 (two) hours as needed for Migraine. 10 tablet 0   . tiZANidine (ZANAFLEX) 4 MG tablet Take 4 mg by mouth as needed.     1   . valacyclovir (VALTREX) 1000 MG tablet Take 2 tablets (2,000 mg total) by mouth 2 (two) times daily. 20 tablet 3   . verapamil (CALAN) 40 MG tablet Take 40 mg by mouth as needed. As needed for SVT         No current facility-administered medications for this visit.        Allergies:  Allergies   Allergen Reactions   . Beef-Derived Products Anaphylaxis     And Pork products   . Erythromycin Hives   . Other Swelling and Respiratory Distress     Bee sting    . Penicillin V Hives   . Penicillins Hives       Past Medical History:  Past Medical History:   Diagnosis Date   . Anxiety    . Arrhythmia     Occasional SVT. (Happens rarely)    . Calculus of kidney    . Depression    . Health care maintenance very nice patient  Dr Fayrene Helper husband is my patient  Colo done 2015 due 2020 Pap 2015  Mammogram 2015   . Herpes    . Hypothyroidism    . Low back pain    . Ovarian cyst    . Post-operative nausea and vomiting     APFEL 3, after last laparoscopy woke up w/ SEVERE pain   . Procreative management    . SVT (supraventricular tachycardia)        Past Surgical History:  Past Surgical History:   Procedure Laterality Date   . COLONOSCOPY     . FOOT SURGERY     . LAPAROSCOPIC, APPENDECTOMY N/A 07/13/2015    Procedure: LAPAROSCOPIC, APPENDECTOMY;  Surgeon: Delena Serve, MD;  Location: [facility];  Service: General;  Laterality: N/A;   . LAPAROSCOPIC, ENTEROLYSIS N/A 07/13/2015    Procedure: LAPAROSCOPIC, LYSIS OF ADHESIONS;  Surgeon: Delena Serve, MD;  Location: [facility];  Service: General;  Laterality: N/A;   . LAPAROSCOPIC, LYSIS, ADHESIONS N/A 11/22/2014    Procedure: LAPAROSCOPIC, LYSIS, ADHESIONS;  Surgeon: Waldon Merl, MD;  Location: [facility];  Service: Gynecology;  Laterality: N/A;   . LAPAROSCOPIC, OPERATIVE N/A 11/22/2014     Procedure: LAPAROSCOPIC OVARIAN CYSTOTOMY;  Surgeon: Waldon Merl, MD;  Location: [facility];  Service: Gynecology;  Laterality: N/A;   . LAPAROSCOPIC, OPERATIVE N/A 07/13/2015    Procedure: LAPAROSCOPIC  SALPINGO - OOPHORECTOMY;  Surgeon: Ronalee Red, MD;  Location: [facility];  Service: Gynecology;  Laterality: N/A;   . LAPAROSCOPY, DIAGNOSTIC N/A 07/13/2015    Procedure: LAPAROSCOPY, DIAGNOSTIC;  Surgeon: Delena Serve, MD;  Location: [facility];  Service: General;  Laterality: N/A;   . REDUCTION MAMMAPLASTY     . TONSILLECTOMY         Family History:  Family History   Problem Relation Age of Onset   . Hyperlipidemia Mother    . Hyperlipidemia Father    . Skin cancer Sister    . Breast cancer Sister 75   . Diabetes Sister    . Hyperlipidemia Sister    . Heart attack Brother    . Hyperlipidemia Brother    . Breast cancer Maternal Aunt 54   . Cervical cancer Maternal Aunt    . Lung cancer Maternal Uncle    . Brain cancer Paternal Uncle    . Diabetes Maternal Grandmother    . Diabetes Paternal Grandfather    . Cancer Cousin    . Colon cancer Neg Hx    . Ovarian cancer Neg Hx        Social History:  Social History     Social History   . Marital status: Married     Spouse name: N/A   . Number of children: N/A   . Years of education: N/A     Occupational History   . Not on file.     Social History Main Topics   . Smoking status: Never Smoker   . Smokeless tobacco: Never Used   . Alcohol use 0.6 oz/week     1 Standard drinks or equivalent per week      Comment: rare   . Drug use: No   . Sexual activity: Yes     Partners: Male     Birth control/ protection: None     Other Topics Concern   . Not on file     Social History Narrative   . No narrative on file  The following sections were reviewed this encounter by the provider:   Tobacco  Allergies  Meds  Med Hx  Surg Hx  Fam Hx  Soc Hx        ROS:  Review of Systems   Eyes: Negative for blurred vision.   Gastrointestinal: Negative for  abdominal pain and vomiting.   Musculoskeletal: Negative for neck pain.   Neurological: Positive for headaches. Negative for dizziness, tingling, seizures, weakness, numbness and loss of balance.   Psychiatric/Behavioral: The patient does not have insomnia.        Vitals:  BP 114/77   Pulse 81   Temp 97.8 F (36.6 C) (Oral)   Ht 1.626 m (5\' 4" )   Wt 103 kg (227 lb)   BMI 38.96 kg/m      Objective:     Physical Exam:  Physical Exam   Constitutional: She appears well-developed and well-nourished.   Cardiovascular: Normal rate, regular rhythm and normal heart sounds.    Pulmonary/Chest: Effort normal and breath sounds normal.   Abdominal: Soft. Bowel sounds are normal.   Skin: Skin is warm and dry.   Nursing note and vitals reviewed.       Assessment:     1. Nonintractable headache, unspecified chronicity pattern, unspecified headache type  - MRI Brain W WO Contrast      Plan:     Check labs  Lori Liew Angelene Giovanni, MD

## 2016-12-27 NOTE — Addendum Note (Signed)
Addended by: Shea Evans on: 12/27/2016 08:26 AM     Modules accepted: Orders

## 2016-12-27 NOTE — Addendum Note (Signed)
Addended by: Shea Evans on: 12/27/2016 08:22 AM     Modules accepted: Orders

## 2016-12-30 ENCOUNTER — Encounter (INDEPENDENT_AMBULATORY_CARE_PROVIDER_SITE_OTHER): Payer: Self-pay | Admitting: Internal Medicine

## 2016-12-31 ENCOUNTER — Encounter (INDEPENDENT_AMBULATORY_CARE_PROVIDER_SITE_OTHER): Payer: Self-pay | Admitting: Internal Medicine

## 2017-01-01 ENCOUNTER — Encounter (INDEPENDENT_AMBULATORY_CARE_PROVIDER_SITE_OTHER): Payer: Self-pay | Admitting: Internal Medicine

## 2017-01-07 ENCOUNTER — Other Ambulatory Visit (INDEPENDENT_AMBULATORY_CARE_PROVIDER_SITE_OTHER): Payer: Self-pay | Admitting: Internal Medicine

## 2017-01-07 DIAGNOSIS — E559 Vitamin D deficiency, unspecified: Secondary | ICD-10-CM

## 2017-01-09 ENCOUNTER — Ambulatory Visit (INDEPENDENT_AMBULATORY_CARE_PROVIDER_SITE_OTHER): Payer: BLUE CROSS/BLUE SHIELD | Admitting: Internal Medicine

## 2017-01-18 ENCOUNTER — Other Ambulatory Visit (INDEPENDENT_AMBULATORY_CARE_PROVIDER_SITE_OTHER): Payer: Self-pay | Admitting: Internal Medicine

## 2017-01-27 ENCOUNTER — Other Ambulatory Visit (INDEPENDENT_AMBULATORY_CARE_PROVIDER_SITE_OTHER): Payer: Self-pay | Admitting: Internal Medicine

## 2017-01-27 DIAGNOSIS — F5101 Primary insomnia: Secondary | ICD-10-CM

## 2017-01-28 ENCOUNTER — Other Ambulatory Visit (INDEPENDENT_AMBULATORY_CARE_PROVIDER_SITE_OTHER): Payer: Self-pay | Admitting: Internal Medicine

## 2017-01-28 DIAGNOSIS — F5101 Primary insomnia: Secondary | ICD-10-CM

## 2017-01-28 NOTE — Addendum Note (Signed)
Addended by: Ulla Potash on: 01/28/2017 09:50 AM     Modules accepted: Orders

## 2017-01-28 NOTE — Telephone Encounter (Signed)
Patient needs a refill on Eszopiclone 3 MG tablet

## 2017-02-10 ENCOUNTER — Ambulatory Visit (INDEPENDENT_AMBULATORY_CARE_PROVIDER_SITE_OTHER): Payer: BLUE CROSS/BLUE SHIELD | Admitting: Internal Medicine

## 2017-02-21 ENCOUNTER — Other Ambulatory Visit (INDEPENDENT_AMBULATORY_CARE_PROVIDER_SITE_OTHER): Payer: Self-pay | Admitting: Internal Medicine

## 2017-02-22 ENCOUNTER — Other Ambulatory Visit (INDEPENDENT_AMBULATORY_CARE_PROVIDER_SITE_OTHER): Payer: Self-pay | Admitting: Internal Medicine

## 2017-02-22 DIAGNOSIS — E559 Vitamin D deficiency, unspecified: Secondary | ICD-10-CM

## 2017-03-23 ENCOUNTER — Other Ambulatory Visit (INDEPENDENT_AMBULATORY_CARE_PROVIDER_SITE_OTHER): Payer: Self-pay | Admitting: Internal Medicine

## 2017-04-01 ENCOUNTER — Encounter (INDEPENDENT_AMBULATORY_CARE_PROVIDER_SITE_OTHER): Payer: Self-pay | Admitting: Internal Medicine

## 2017-04-18 ENCOUNTER — Other Ambulatory Visit (INDEPENDENT_AMBULATORY_CARE_PROVIDER_SITE_OTHER): Payer: Self-pay | Admitting: Internal Medicine

## 2017-04-18 DIAGNOSIS — E559 Vitamin D deficiency, unspecified: Secondary | ICD-10-CM

## 2017-04-20 ENCOUNTER — Other Ambulatory Visit (INDEPENDENT_AMBULATORY_CARE_PROVIDER_SITE_OTHER): Payer: Self-pay | Admitting: Internal Medicine

## 2017-04-23 ENCOUNTER — Encounter (INDEPENDENT_AMBULATORY_CARE_PROVIDER_SITE_OTHER): Payer: Self-pay | Admitting: Internal Medicine

## 2017-04-30 ENCOUNTER — Encounter (INDEPENDENT_AMBULATORY_CARE_PROVIDER_SITE_OTHER): Payer: Self-pay | Admitting: Internal Medicine

## 2017-05-01 ENCOUNTER — Encounter (INDEPENDENT_AMBULATORY_CARE_PROVIDER_SITE_OTHER): Payer: Self-pay | Admitting: Internal Medicine

## 2017-05-02 ENCOUNTER — Encounter (INDEPENDENT_AMBULATORY_CARE_PROVIDER_SITE_OTHER): Payer: Self-pay | Admitting: Internal Medicine

## 2017-05-07 ENCOUNTER — Ambulatory Visit (INDEPENDENT_AMBULATORY_CARE_PROVIDER_SITE_OTHER): Payer: BLUE CROSS/BLUE SHIELD | Admitting: Internal Medicine

## 2017-05-08 ENCOUNTER — Encounter (INDEPENDENT_AMBULATORY_CARE_PROVIDER_SITE_OTHER): Payer: Self-pay | Admitting: Internal Medicine

## 2017-05-08 ENCOUNTER — Ambulatory Visit (FREE_STANDING_LABORATORY_FACILITY): Payer: BLUE CROSS/BLUE SHIELD | Admitting: Internal Medicine

## 2017-05-08 VITALS — BP 109/76 | HR 80 | Resp 15 | Ht 68.0 in | Wt 232.0 lb

## 2017-05-08 DIAGNOSIS — Z1239 Encounter for other screening for malignant neoplasm of breast: Secondary | ICD-10-CM

## 2017-05-08 DIAGNOSIS — Z1231 Encounter for screening mammogram for malignant neoplasm of breast: Secondary | ICD-10-CM

## 2017-05-08 DIAGNOSIS — Z01818 Encounter for other preprocedural examination: Secondary | ICD-10-CM

## 2017-05-08 LAB — CBC
Absolute NRBC: 0 10*3/uL
Hematocrit: 44.1 % (ref 37.0–47.0)
Hgb: 13.5 g/dL (ref 12.0–16.0)
MCH: 29.7 pg (ref 28.0–32.0)
MCHC: 30.6 g/dL — ABNORMAL LOW (ref 32.0–36.0)
MCV: 97.1 fL (ref 80.0–100.0)
MPV: 10.1 fL (ref 9.4–12.3)
Nucleated RBC: 0 /100 WBC (ref 0.0–1.0)
Platelets: 280 10*3/uL (ref 140–400)
RBC: 4.54 10*6/uL (ref 4.20–5.40)
RDW: 14 % (ref 12–15)
WBC: 7.66 10*3/uL (ref 3.50–10.80)

## 2017-05-08 LAB — BASIC METABOLIC PANEL
BUN: 16 mg/dL (ref 7.0–19.0)
CO2: 29 mEq/L (ref 21–29)
Calcium: 10.2 mg/dL (ref 8.5–10.5)
Chloride: 108 mEq/L (ref 100–111)
Creatinine: 0.9 mg/dL (ref 0.4–1.5)
Glucose: 96 mg/dL (ref 70–100)
Potassium: 4.8 mEq/L (ref 3.5–5.1)
Sodium: 144 mEq/L (ref 136–145)

## 2017-05-08 LAB — PT/INR
PT INR: 1 (ref 0.9–1.1)
PT: 13 s (ref 12.6–15.0)

## 2017-05-08 LAB — GFR: EGFR: >60

## 2017-05-08 LAB — HEMOLYSIS INDEX: Hemolysis Index: 6 (ref 0–18)

## 2017-05-08 NOTE — Progress Notes (Signed)
Subjective:      Patient ID: Gwendolyn [name] is a 56 y.o. female.    Chief Complaint:  Chief Complaint   Patient presents with   . Pre-op Exam       HPI:  56 year old in for pre op exam         Problem List:  Patient Active Problem List   Diagnosis   . Right ovarian cyst   . Pain, female pelvic   . Obesity (BMI 30-39.9)       Current Medications:  Current Outpatient Prescriptions   Medication Sig Dispense Refill   . acetaZOLAMIDE (DIAMOX) 125 MG tablet Take 125 mg by mouth 3 (three) times daily. Takes as needed. Last dose about 3 months ago. Takes for dizziness. PRN        . ALPRAZolam (XANAX) 0.5 MG tablet Take 1 tablet (0.5 mg total) by mouth as needed for Anxiety. 30 tablet 0   . ARMOUR THYROID 30 MG tablet TAKE 1 TABLET BY MOUTH DAILY 30 tablet 0   . buPROPion XL (WELLBUTRIN XL) 150 MG 24 hr tablet TAKE 1 TABLET BY MOUTH DAILY 30 tablet 0   . diazePAM (VALIUM) 5 MG tablet   0   . DUEXIS 800-26.6 MG Tab      . Eszopiclone 3 MG tablet Take 1 tablet (3 mg total) by mouth nightly.Take immediately before bedtime 30 tablet 0   . Ibuprofen-Famotidine (DUEXIS) 800-26.6 MG Tab      . oxyCODONE (ROXICODONE) 5 MG immediate release tablet      . tiZANidine (ZANAFLEX) 4 MG tablet Take 4 mg by mouth as needed.     1   . valacyclovir (VALTREX) 1000 MG tablet Take 2 tablets (2,000 mg total) by mouth 2 (two) times daily. 20 tablet 3   .       . vitamin D, ergocalciferol, (DRISDOL) 50000 UNIT Cap TAKE 1 CAPSULE BY MOUTH 1 TIME A WEEK 8 capsule 0     No current facility-administered medications for this visit.        Allergies:  Allergies   Allergen Reactions   . Beef-Derived Products Anaphylaxis     And Pork products   . Erythromycin Hives   . Other Swelling and Respiratory Distress     Bee sting    . Penicillin V Hives   . Penicillins Hives       Past Medical History:  Past Medical History:   Diagnosis Date   . Anxiety    . Arrhythmia     Occasional SVT. (Happens rarely)    . Calculus of kidney    . Depression    . Health care  maintenance very nice patient     Dr Fayrene Helper husband is my patient  Colo done 2015 due 2020 Pap 2015  Mammogram 2015   . Herpes    . Hypothyroidism    . Low back pain    . Ovarian cyst    . Post-operative nausea and vomiting     APFEL 3, after last laparoscopy woke up w/ SEVERE pain   . Procreative management    . SVT (supraventricular tachycardia)        Past Surgical History:  Past Surgical History:   Procedure Laterality Date   . COLONOSCOPY     . FOOT SURGERY     . LAPAROSCOPIC, APPENDECTOMY N/A 07/13/2015    Procedure: LAPAROSCOPIC, APPENDECTOMY;  Surgeon: Delena Serve, MD;  Location: [facility];  Service:  General;  Laterality: N/A;   . LAPAROSCOPIC, ENTEROLYSIS N/A 07/13/2015    Procedure: LAPAROSCOPIC, LYSIS OF ADHESIONS;  Surgeon: Delena Serve, MD;  Location: [facility];  Service: General;  Laterality: N/A;   . LAPAROSCOPIC, LYSIS, ADHESIONS N/A 11/22/2014    Procedure: LAPAROSCOPIC, LYSIS, ADHESIONS;  Surgeon: Waldon Merl, MD;  Location: [facility];  Service: Gynecology;  Laterality: N/A;   . LAPAROSCOPIC, OPERATIVE N/A 11/22/2014    Procedure: LAPAROSCOPIC OVARIAN CYSTOTOMY;  Surgeon: Waldon Merl, MD;  Location: [facility];  Service: Gynecology;  Laterality: N/A;   . LAPAROSCOPIC, OPERATIVE N/A 07/13/2015    Procedure: LAPAROSCOPIC  SALPINGO - OOPHORECTOMY;  Surgeon: Ronalee Red, MD;  Location: [facility];  Service: Gynecology;  Laterality: N/A;   . LAPAROSCOPY, DIAGNOSTIC N/A 07/13/2015    Procedure: LAPAROSCOPY, DIAGNOSTIC;  Surgeon: Delena Serve, MD;  Location: [facility];  Service: General;  Laterality: N/A;   . REDUCTION MAMMAPLASTY     . TONSILLECTOMY         Family History:  Family History   Problem Relation Age of Onset   . Hyperlipidemia Mother    . Hyperlipidemia Father    . Skin cancer Sister    . Breast cancer Sister 35   . Diabetes Sister    . Hyperlipidemia Sister    . Heart attack Brother    . Hyperlipidemia Brother    . Breast cancer Maternal  Aunt 54   . Cervical cancer Maternal Aunt    . Lung cancer Maternal Uncle    . Brain cancer Paternal Uncle    . Diabetes Maternal Grandmother    . Diabetes Paternal Grandfather    . Cancer Cousin    . Colon cancer Neg Hx    . Ovarian cancer Neg Hx        Social History:  Social History     Social History   . Marital status: Married     Spouse name: N/A   . Number of children: N/A   . Years of education: N/A     Occupational History   . Not on file.     Social History Main Topics   . Smoking status: Never Smoker   . Smokeless tobacco: Never Used   . Alcohol use 0.6 oz/week     1 Standard drinks or equivalent per week      Comment: rare   . Drug use: No   . Sexual activity: Yes     Partners: Male     Birth control/ protection: None     Other Topics Concern   . Not on file     Social History Narrative   . No narrative on file       The following sections were reviewed this encounter by the provider: Meds           ROS:  Review of Systems   Constitutional: Negative for activity change, appetite change, fatigue and unexpected weight change.   HENT: Negative for congestion, ear pain, hearing loss, postnasal drip, sinus pressure and sore throat.    Eyes: Negative for pain and visual disturbance.   Respiratory: Negative for cough, chest tightness, shortness of breath and wheezing.    Cardiovascular: Negative for chest pain and palpitations.   Gastrointestinal: Negative for abdominal pain, blood in stool, constipation, diarrhea and nausea.   Genitourinary: Negative for decreased urine volume, difficulty urinating, dyspareunia, frequency, hematuria, menstrual problem, pelvic pain, vaginal bleeding, vaginal discharge  and vaginal pain.   Musculoskeletal: Negative for arthralgias and myalgias.   Skin: Negative for color change and rash.   Neurological: Negative for dizziness, weakness, numbness and headaches.   Hematological: Negative for adenopathy.   Psychiatric/Behavioral: Negative for agitation, behavioral problems,  self-injury, sleep disturbance and suicidal ideas.       Vitals:  BP 109/76   Pulse 80   Resp 15   Ht 1.727 m (5\' 8" )   Wt 105.2 kg (232 lb)   SpO2 95%   BMI 35.28 kg/m      Objective:     Physical Exam:  Physical Exam   Constitutional: She is oriented to person, place, and time. She appears well-developed and well-nourished.   HENT:   Head: Normocephalic and atraumatic.   Right Ear: External ear normal.   Left Ear: External ear normal.   Nose: Nose normal.   Mouth/Throat: Oropharynx is clear and moist.   Eyes: EOM are normal. Right eye exhibits no discharge. Left eye exhibits no discharge.   Neck: Normal range of motion. Neck supple. No JVD present. No thyromegaly present.   Cardiovascular: Normal rate, regular rhythm, normal heart sounds and intact distal pulses.    No murmur heard.  Pulmonary/Chest: Effort normal and breath sounds normal.   Abdominal: Soft. Bowel sounds are normal. She exhibits no distension and no mass. There is no rebound and no guarding.   Musculoskeletal: Normal range of motion. She exhibits no edema or tenderness.   Lymphadenopathy:     She has no cervical adenopathy.   Neurological: She is oriented to person, place, and time. She has normal reflexes.   Skin: Skin is warm and dry.   Psychiatric: She has a normal mood and affect. Her behavior is normal.   Nursing note and vitals reviewed.       Assessment:     56 year old in for pre op exam    Plan:       If benefits of surgery outweigh risks ok to proceed with normal risks and benefits explained to patient

## 2017-05-09 ENCOUNTER — Ambulatory Visit: Payer: BLUE CROSS/BLUE SHIELD

## 2017-05-13 ENCOUNTER — Ambulatory Visit: Payer: BLUE CROSS/BLUE SHIELD | Admitting: Anesthesiology

## 2017-05-13 ENCOUNTER — Encounter: Payer: Self-pay | Admitting: Anesthesiology

## 2017-05-13 ENCOUNTER — Encounter
Admission: RE | Disposition: A | Discharge status: Home / Self Care | Payer: Self-pay | Source: Ambulatory Visit | Attending: Orthopaedic Surgery

## 2017-05-13 ENCOUNTER — Ambulatory Visit
Admission: RE | Admit: 2017-05-13 | Discharge: 2017-05-13 | Disposition: A | Discharge status: Home / Self Care | Payer: BLUE CROSS/BLUE SHIELD | Source: Ambulatory Visit | Attending: Orthopaedic Surgery | Admitting: Orthopaedic Surgery

## 2017-05-13 DIAGNOSIS — X58XXXA Exposure to other specified factors, initial encounter: Secondary | ICD-10-CM | POA: Insufficient documentation

## 2017-05-13 DIAGNOSIS — M65871 Other synovitis and tenosynovitis, right ankle and foot: Secondary | ICD-10-CM | POA: Insufficient documentation

## 2017-05-13 DIAGNOSIS — E039 Hypothyroidism, unspecified: Secondary | ICD-10-CM | POA: Insufficient documentation

## 2017-05-13 DIAGNOSIS — Z7982 Long term (current) use of aspirin: Secondary | ICD-10-CM | POA: Insufficient documentation

## 2017-05-13 DIAGNOSIS — S86311A Strain of muscle(s) and tendon(s) of peroneal muscle group at lower leg level, right leg, initial encounter: Secondary | ICD-10-CM | POA: Insufficient documentation

## 2017-05-13 HISTORY — DX: Headache, unspecified: R51.9

## 2017-05-13 HISTORY — DX: Unspecified osteoarthritis, unspecified site: M19.90

## 2017-05-13 SURGERY — REPAIR, LOWER EXTREMITY TENDON
Anesthesia: Anesthesia General | Site: Leg Lower | Laterality: Right | Wound class: Clean

## 2017-05-13 MED ORDER — FENTANYL CITRATE (PF) 50 MCG/ML IJ SOLN (WRAP)
INTRAMUSCULAR | Status: AC
Start: 2017-05-13 — End: ?
  Filled 2017-05-13: qty 2

## 2017-05-13 MED ORDER — PROPOFOL 10 MG/ML IV EMUL (WRAP)
INTRAVENOUS | Status: AC
Start: 2017-05-13 — End: ?
  Filled 2017-05-13: qty 20

## 2017-05-13 MED ORDER — LIDOCAINE HCL (PF) 2 % IJ SOLN
INTRAMUSCULAR | Status: AC
Start: 2017-05-13 — End: ?
  Filled 2017-05-13: qty 5

## 2017-05-13 MED ORDER — LIDOCAINE HCL (PF) 2 % IJ SOLN
INTRAMUSCULAR | Status: DC | PRN
Start: 2017-05-13 — End: 2017-05-13
  Administered 2017-05-13: 100 mg via INTRAVENOUS

## 2017-05-13 MED ORDER — FAMOTIDINE 20 MG/2ML IV SOLN
INTRAVENOUS | Status: AC
Start: 2017-05-13 — End: ?
  Filled 2017-05-13: qty 2

## 2017-05-13 MED ORDER — BUPIVACAINE HCL 0.5 % IJ SOLN
INTRAMUSCULAR | Status: DC | PRN
Start: 2017-05-13 — End: 2017-05-13
  Administered 2017-05-13: 12 mL

## 2017-05-13 MED ORDER — BUPIVACAINE HCL (PF) 0.5 % IJ SOLN
INTRAMUSCULAR | Status: AC
Start: 2017-05-13 — End: ?
  Filled 2017-05-13: qty 30

## 2017-05-13 MED ORDER — HYDROMORPHONE HCL 1 MG/ML IJ SOLN
0.5000 mg | INTRAMUSCULAR | Status: AC | PRN
Start: 2017-05-13 — End: 2017-05-13
  Administered 2017-05-13 (×2): 0.5 mg via INTRAVENOUS

## 2017-05-13 MED ORDER — OXYCODONE HCL 5 MG PO TABS
5.0000 mg | ORAL_TABLET | Freq: Once | ORAL | Status: AC | PRN
Start: 2017-05-13 — End: 2017-05-13

## 2017-05-13 MED ORDER — MIDAZOLAM HCL 2 MG/2ML IJ SOLN
INTRAMUSCULAR | Status: DC | PRN
Start: 2017-05-13 — End: 2017-05-13
  Administered 2017-05-13: 2 mg via INTRAVENOUS

## 2017-05-13 MED ORDER — ASPIRIN 325 MG PO TABS
325.0000 mg | ORAL_TABLET | Freq: Every day | ORAL | 0 refills | Status: DC
Start: 2017-05-14 — End: 2017-09-01

## 2017-05-13 MED ORDER — SCOPOLAMINE 1 MG/3DAYS TD PT72
1.0000 | MEDICATED_PATCH | Freq: Once | TRANSDERMAL | Status: DC
Start: 2017-05-13 — End: 2017-05-13
  Administered 2017-05-13: 11:00:00 1 via TRANSDERMAL

## 2017-05-13 MED ORDER — HYDROMORPHONE HCL 1 MG/ML IJ SOLN
INTRAMUSCULAR | Status: AC
Start: 2017-05-13 — End: 2017-05-13
  Administered 2017-05-13: 16:00:00 0.5 mg via INTRAVENOUS
  Filled 2017-05-13: qty 1

## 2017-05-13 MED ORDER — PROPOFOL 10 MG/ML IV EMUL (WRAP)
INTRAVENOUS | Status: AC
Start: 2017-05-13 — End: ?
  Filled 2017-05-13: qty 60

## 2017-05-13 MED ORDER — MIDAZOLAM HCL 2 MG/2ML IJ SOLN
INTRAMUSCULAR | Status: AC
Start: 2017-05-13 — End: ?
  Filled 2017-05-13: qty 2

## 2017-05-13 MED ORDER — ONDANSETRON HCL 4 MG/2ML IJ SOLN
INTRAMUSCULAR | Status: AC
Start: 2017-05-13 — End: 2017-05-13
  Administered 2017-05-13: 16:00:00 4 mg via INTRAVENOUS
  Filled 2017-05-13: qty 2

## 2017-05-13 MED ORDER — FENTANYL CITRATE (PF) 50 MCG/ML IJ SOLN (WRAP)
INTRAMUSCULAR | Status: AC
Start: 2017-05-13 — End: 2017-05-13
  Administered 2017-05-13: 16:00:00 50 ug via INTRAVENOUS
  Filled 2017-05-13: qty 2

## 2017-05-13 MED ORDER — DIPHENHYDRAMINE HCL 50 MG/ML IJ SOLN
INTRAMUSCULAR | Status: DC | PRN
Start: 2017-05-13 — End: 2017-05-13
  Administered 2017-05-13: 10 mg via INTRAVENOUS

## 2017-05-13 MED ORDER — DEXAMETHASONE SODIUM PHOSPHATE 4 MG/ML IJ SOLN
INTRAMUSCULAR | Status: AC
Start: 2017-05-13 — End: ?
  Filled 2017-05-13: qty 1

## 2017-05-13 MED ORDER — HYDROMORPHONE HCL 2 MG PO TABS
ORAL_TABLET | ORAL | Status: AC
Start: 2017-05-13 — End: 2017-05-13
  Administered 2017-05-13: 18:00:00 2 mg via ORAL
  Filled 2017-05-13: qty 1

## 2017-05-13 MED ORDER — PROPOFOL 10 MG/ML IV EMUL (WRAP)
INTRAVENOUS | Status: AC
Start: 2017-05-13 — End: ?
  Filled 2017-05-13: qty 50

## 2017-05-13 MED ORDER — CLINDAMYCIN PHOSPHATE IN D5W 900 MG/50ML IV SOLN
INTRAVENOUS | Status: AC
Start: 2017-05-13 — End: ?
  Filled 2017-05-13: qty 50

## 2017-05-13 MED ORDER — ONDANSETRON HCL 4 MG/2ML IJ SOLN
4.0000 mg | Freq: Once | INTRAMUSCULAR | Status: AC | PRN
Start: 2017-05-13 — End: 2017-05-13

## 2017-05-13 MED ORDER — PROMETHAZINE HCL 25 MG/ML IJ SOLN
6.2500 mg | Freq: Once | INTRAMUSCULAR | Status: DC | PRN
Start: 2017-05-13 — End: 2017-05-13

## 2017-05-13 MED ORDER — LIDOCAINE HCL 1 % IJ SOLN
INTRAMUSCULAR | Status: DC | PRN
Start: 2017-05-13 — End: 2017-05-13
  Administered 2017-05-13: 13 mL

## 2017-05-13 MED ORDER — OXYCODONE HCL 5 MG PO TABS
ORAL_TABLET | ORAL | Status: AC
Start: 2017-05-13 — End: 2017-05-13
  Administered 2017-05-13: 17:00:00 5 mg via ORAL
  Filled 2017-05-13: qty 1

## 2017-05-13 MED ORDER — PROPOFOL INFUSION 10 MG/ML
INTRAVENOUS | Status: DC | PRN
Start: 2017-05-13 — End: 2017-05-13
  Administered 2017-05-13: 50 mg via INTRAVENOUS
  Administered 2017-05-13: 250 mg via INTRAVENOUS

## 2017-05-13 MED ORDER — FENTANYL CITRATE (PF) 50 MCG/ML IJ SOLN (WRAP)
50.0000 ug | INTRAMUSCULAR | Status: AC | PRN
Start: 2017-05-13 — End: 2017-05-13
  Administered 2017-05-13 (×2): 50 ug via INTRAVENOUS

## 2017-05-13 MED ORDER — ONDANSETRON HCL 4 MG/2ML IJ SOLN
INTRAMUSCULAR | Status: DC | PRN
Start: 2017-05-13 — End: 2017-05-13
  Administered 2017-05-13: 4 mg via INTRAVENOUS

## 2017-05-13 MED ORDER — HYDROMORPHONE HCL 2 MG PO TABS
2.0000 mg | ORAL_TABLET | Freq: Once | ORAL | Status: AC | PRN
Start: 2017-05-13 — End: 2017-05-13

## 2017-05-13 MED ORDER — SODIUM CHLORIDE 0.9 % IJ SOLN
INTRAMUSCULAR | Status: AC
Start: 2017-05-13 — End: ?
  Filled 2017-05-13: qty 10

## 2017-05-13 MED ORDER — LIDOCAINE HCL 1 % IJ SOLN
INTRAMUSCULAR | Status: AC
Start: 2017-05-13 — End: ?
  Filled 2017-05-13: qty 20

## 2017-05-13 MED ORDER — FENTANYL CITRATE (PF) 50 MCG/ML IJ SOLN (WRAP)
INTRAMUSCULAR | Status: DC | PRN
Start: 2017-05-13 — End: 2017-05-13
  Administered 2017-05-13 (×2): 25 ug via INTRAVENOUS
  Administered 2017-05-13 (×2): 50 ug via INTRAVENOUS

## 2017-05-13 MED ORDER — LACTATED RINGERS IV SOLN
INTRAVENOUS | Status: DC
Start: 2017-05-13 — End: 2017-05-13

## 2017-05-13 MED ORDER — SCOPOLAMINE 1 MG/3DAYS TD PT72
MEDICATED_PATCH | TRANSDERMAL | Status: AC
Start: 2017-05-13 — End: ?
  Filled 2017-05-13: qty 1

## 2017-05-13 MED ORDER — ONDANSETRON HCL 4 MG/2ML IJ SOLN
INTRAMUSCULAR | Status: AC
Start: 2017-05-13 — End: ?
  Filled 2017-05-13: qty 2

## 2017-05-13 MED ORDER — DEXAMETHASONE SODIUM PHOSPHATE 4 MG/ML IJ SOLN (WRAP)
INTRAMUSCULAR | Status: DC | PRN
Start: 2017-05-13 — End: 2017-05-13
  Administered 2017-05-13: 8 mg via INTRAVENOUS

## 2017-05-13 MED ORDER — HYDROXYZINE PAMOATE 25 MG PO CAPS
25.0000 mg | ORAL_CAPSULE | Freq: Four times a day (QID) | ORAL | 1 refills | Status: DC | PRN
Start: 2017-05-13 — End: 2017-09-01

## 2017-05-13 MED ORDER — DIPHENHYDRAMINE HCL 50 MG/ML IJ SOLN
INTRAMUSCULAR | Status: AC
Start: 2017-05-13 — End: ?
  Filled 2017-05-13: qty 1

## 2017-05-13 MED ORDER — ROCURONIUM BROMIDE 50 MG/5ML IV SOLN
INTRAVENOUS | Status: AC
Start: 2017-05-13 — End: ?
  Filled 2017-05-13: qty 5

## 2017-05-13 MED ORDER — CLINDAMYCIN PHOSPHATE IN D5W 900 MG/50ML IV SOLN
900.0000 mg | Freq: Once | INTRAVENOUS | Status: AC
Start: 2017-05-13 — End: 2017-05-13
  Administered 2017-05-13: 14:00:00 900 mg via INTRAVENOUS

## 2017-05-13 MED ORDER — PROPOFOL INFUSION 10 MG/ML
INTRAVENOUS | Status: DC | PRN
Start: 2017-05-13 — End: 2017-05-13
  Administered 2017-05-13: 140 ug/kg/min via INTRAVENOUS

## 2017-05-13 MED ORDER — FAMOTIDINE 10 MG/ML IV SOLN (WRAP)
INTRAVENOUS | Status: DC | PRN
Start: 2017-05-13 — End: 2017-05-13
  Administered 2017-05-13: 20 mg via INTRAVENOUS

## 2017-05-13 MED ORDER — HYDROMORPHONE HCL 2 MG PO TABS
2.0000 mg | ORAL_TABLET | ORAL | 0 refills | Status: DC | PRN
Start: 2017-05-13 — End: 2017-09-01

## 2017-05-13 MED ORDER — LACTATED RINGERS IV SOLN
INTRAVENOUS | Status: DC | PRN
Start: 2017-05-13 — End: 2017-05-13

## 2017-05-13 SURGICAL SUPPLY — 51 items
BANDAGE CMPR PLSTR CTTN MED MTRX 5YDX6IN (Bandage) ×2
BANDAGE ELASTIC L5 YD X W6 IN W/SELF-CLOSURE HOOK (Bandage) ×2 IMPLANT
BANDAGE ESMARK STRL 4INX9FT (Procedure Accessories) ×2 IMPLANT
BANDAGE MEDIUM COMPRESSION L5 YD X W4 IN ELASTIC HOOK LOOP CLOSURE (Bandage) ×1 IMPLANT
BANDAGE MEDIUM ELASTIC MATRIX POLYESTER COTTON L5 YD X W6 IN (Bandage) IMPLANT
BANDAGE MEDLINE MEDIUM COMPRESSION L5 YD (Bandage) ×3
BLADE SAW THK.4 MM THK.6 MM SAGITTAL (Blade) ×1
BLADE SAW THK.4 MM THK.6 MM SAGITTAL STRAIGHT EDGE FINE PNEUMICRO (Blade) IMPLANT
BLADE SW THK.4MM THK.6MM 25.7X9.5MM SGTL (Blade) ×1
BNDG MTRX CMPR 5YDX4IN MED PLSTR CTTN (Bandage) ×1
CUFF TRNQT CYL CLR CUF 34X4IN LF STRL 2 (Procedure Accessories) ×1 IMPLANT
DRAPE SRG PLS U STRDRP 51X47IN LF STRL (Drape) ×1
DRAPE SURGICAL ADHESIVE L51 IN X W47 IN (Drape) ×1
DRAPE SURGICAL ADHESIVE L51 IN X W47 IN STERI-DRAPE CLEAR (Drape) ×1 IMPLANT
DRESSING PETRO 3% BI 3BRM GZE XR 8X1IN (Dressing) ×1
DRESSING PETROLATUM XEROFORM L8 IN X W1 (Dressing) ×1
DRESSING PETROLATUM XEROFORM L8 IN X W1 IN 3% BISMUTH TRIBROMOPHENATE (Dressing) ×1 IMPLANT
ELECTRODE ELECTROSURGICAL BLADE PENCIL L10 FT OD3/8 IN PLUMEPEN ELITE (Cautery) ×1 IMPLANT
ELECTRODE ESURG BLDE PNCL PLUMEPEN ELT (Cautery) ×2
GLOVE BIOGEL SURG SENSE SZ 7.0 (Glove) ×2 IMPLANT
GLOVE SURG BIOGEL INDIC SZ 7.5 (Glove) ×2 IMPLANT
NEEDLE 25GA 1 1/2 (Needles) ×4 IMPLANT
PADDING CAST L4 YD X W6 IN UNDERCAST (Bandage) ×3
PADDING CAST L4 YD X W6 IN UNDERCAST HAND TEARABLE SPECIALIST 100 (Bandage) IMPLANT
PADDING CST CTTN SPCLST 100 4YDX6IN LF (Bandage) ×3
SOLUTION SRGPRP 74% ISPRP 0.7% IOD (Prep) ×2
SOLUTION SURGICAL PREP 26 ML DURAPREP (Prep) ×2
SOLUTION SURGICAL PREP 26 ML DURAPREP 74% ISOPROPYL ALCOHOL 0.7% (Prep) ×1 IMPLANT
SPLINT ORTH PLSTR OF PARIS SPCLST 15X4IN (Cast) ×4
SPLINT ORTHOPEDIC 15X4IN FAST SET PLASTER OF PARIS SPECIALIST LF (Cast) IMPLANT
SPONGE GAUZE L4 IN X W4 IN 16 PLY (Dressing) ×1
SPONGE GAUZE L4 IN X W4 IN 16 PLY MAXIMUM ABSORBENT USP TYPE VII (Dressing) IMPLANT
SPONGE GZE CTTN CRTY 4X4IN LF NS 16 PLY (Dressing) ×1
STRIP SKIN CLOSURE L3 IN X W.25 IN (Dressing) ×1
STRIP SKIN CLOSURE L3 IN X W.25 IN REINFORCE STERI-STRIP POLYESTER (Dressing) ×1 IMPLANT
STRIP SKNCLS PLSTR STRSTRP 3X.25IN LF (Dressing) ×1
SUTURE ABS 4-0 PS2 MNCRL MTPS 18IN MFL (Suture) ×1
SUTURE ETHIBOND 2-0 CT2 30 (Suture) ×2 IMPLANT
SUTURE MONOCRYL 3-0 PS2 27IN (Suture) ×1 IMPLANT
SUTURE MONOCRYL 4-0 PS-2 L18 IN (Suture) ×1
SUTURE MONOCRYL 4-0 PS-2 L18 IN MONOFILAMENT UNDYED ABSORBABLE (Suture) ×1 IMPLANT
SUTURE MONOCRYL 4-0 PS2 27IN (Suture) ×1 IMPLANT
SUTURE VICRYL 2-0 FS1 27IN (Suture) ×3 IMPLANT
SUTURE VICRYL 3-0 PS2 27IN (Suture) ×2 IMPLANT
SYRINGE LUER LOCK 10CC (Syringes, Needles) ×2 IMPLANT
TOURNIQUET 18IN STRL (Procedure Accessories) ×1 IMPLANT
TRAY EXTREMITY PACK (Pack) ×2 IMPLANT
WAX BN STRL 2.5G NTR (Hemostat) ×1
WAX BONE 2.5 G NATURAL (Hemostat) IMPLANT
WIRE FIXATION OD1.2 MM L150 MM KIRSCHNER (Guide Wire) IMPLANT
WIRE FX KRSH 1.2MM 150MM (Guide Wire) ×2

## 2017-05-13 NOTE — Progress Notes (Signed)
Pt husband and father educated on d/c instructions with all parties giving verbal understanding of information provided. D/c criteria met.VSS with no signs of distress noted. Pt escorted via wheelchair to car driven by father, pt assisted into vehicle.

## 2017-05-13 NOTE — Discharge Instructions (Signed)

## 2017-05-13 NOTE — Anesthesia Postprocedure Evaluation (Signed)
Anesthesia Post Evaluation    Patient: Gwendolyn [name]    Procedures performed: Procedure(s):  RIGHT PERONEAL BREVIS REPAIR AND TENODESIS; RIGHT FIBULA GROOVE DEEPENING.    Anesthesia type: General LMA    Patient location:Phase I PACU    Last vitals:   Vitals:    05/13/17 1700   BP: 125/76   Pulse: 70   Resp: 21   Temp:    SpO2: 99%       Post pain: Patient not complaining of pain, continue current therapy      Mental Status:awake    Respiratory Function: tolerating room air    Cardiovascular: stable    Nausea/Vomiting: Nausea nearly subsided    Hydration Status: adequate    Post assessment: no apparent anesthetic complications    Signed by: Vilinda Flake, 05/13/2017 5:07 PM

## 2017-05-13 NOTE — Progress Notes (Signed)
Patient fitted for walker and teaching done on proper use. Return demonstration completed to satisfaction.

## 2017-05-13 NOTE — Transfer of Care (Signed)
Anesthesia Transfer of Care Note    Patient: Gwendolyn [name]    Procedures performed: Procedure(s):  RIGHT PERONEAL BREVIS REPAIR AND TENODESIS; RIGHT FIBULA GROOVE DEEPENING.    Anesthesia type: General LMA    Patient location:Phase I PACU    Last vitals:   Vitals:    05/13/17 1545   BP: 130/75   Pulse: 93   Resp: 16   Temp: 36.2 C (97.2 F)   SpO2: 94%       Post pain: Patient not complaining of pain, continue current therapy      Mental Status:awake    Respiratory Function: tolerating nasal cannula    Cardiovascular: stable    Nausea/Vomiting: patient not complaining of nausea or vomiting    Hydration Status: adequate    Post assessment: no apparent anesthetic complications    Signed by: Particia Jasper  05/13/17 3:48 PM

## 2017-05-13 NOTE — Brief Op Note (Signed)
BRIEF OP NOTE    Date Time: 05/13/17 3:22 PM    Patient Name:   Gwendolyn [name]    Date of Operation:   05/13/2017    Providers Performing:   Surgeon(s):  Evalin Shawhan, Jeannene Patella, MD  Ceasar Lund, [place]    Assistant (s):   Circulator: Frances Maywood, RN  Relief Circulator: Alfredia Ferguson, RN  Relief Scrub: Jari Pigg, RN  Scrub Person: Naomie Dean, RN    Operative Procedure:   Procedure(s):  RIGHT PERONEAL BREVIS REPAIR AND TENODESIS; RIGHT FIBULA GROOVE DEEPENING.    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

## 2017-05-13 NOTE — Anesthesia Preprocedure Evaluation (Signed)
Anesthesia Evaluation    AIRWAY    Mallampati: II    TM distance: >3 FB  Neck ROM: full  Mouth Opening:full   CARDIOVASCULAR    cardiovascular exam normal       DENTAL    no notable dental hx     PULMONARY    pulmonary exam normal     OTHER FINDINGS    obese          Relevant Problems   No relevant active problems               Anesthesia Plan    ASA 2     general                                 informed consent obtained                   Signed by: Vilinda Flake 05/13/17 11:40 AM

## 2017-05-13 NOTE — Discharge Instr - AVS First Page (Signed)
Dr. Desta Bujak   Discharge Instructions    WHAT TO EXPECT THE FIRST FEW HOURS HOME  . Fatigue, dry mouth, headache, and nausea are all common side effects of anesthesia.   . Rest and hydrate.  Eat small, low fat meals every several hours to combat nausea in the first 48 hours surgery.    . An anti-nausea medication may be taken with your pain medication.  . Do not operate any heavy machines or vehicles in the first 72 hours after surgery or while on narcotic pain medication.  . Difficulty urinating is not uncommon after surgery.  This may be due to the anesthesia or dehydration.  If you have not urinated within 8-12 hours after discharge from the hospital and have a sensation of fullness in your bladder please call Dr. Renuka Farfan's office or cell directly.   . Elevate the operated extremity level above heart level for the first 48 to 72 hours after surgery.  Pillows should be placed beneath the CALF and ANKLE, not beneath the heel.  Your heel should hang freely.  .  If a cold machine has been purchased, this may be used for the first 48 hours or as needed thereafter.     DRESSINGS  . Maintain your surgical splint until your first postoperative visit.   . If for any reason the padding is too tight; loosen the overlying ace wrap and deep cotton (if necessary) and then secure it to your comfort level.  The incision will be covered in a sterile dressing and will not be affected by this.  . Do not get the dressings wet. Call the office if you dressing becomes soiled.   . Some slight, limited drainage may be seen through the dressing.  If this is excessive or increases in size, please call the office.   . Keep pets and children away from the operated area.    ACTIVITY  . Nonweightbearing with crutches, wheel chair, or knee roller.     MANAGING PAIN  . STAY AHEAD OF YOUR PAIN!  . The operated area may feel numb or tingling after surgery if you have received a local nerve block (medication injected at the time of surgery to  numb the area).  This will typically wear off 4 to 12 hours after surgery and will be followed by acute pain.    . Take your medication as prescribed every 3 to 4 hours once you arrive home EVEN IF YOU HAVE NO PAIN at that time to avoid a flare of pain.  . Pain will typically improve after the first 3 to 4 days.  . Narcotics and antihistamines may cause sleepiness, nausea, itchiness, and confusion in some patients.  . Medications you may be prescribed:  o TYLENOL: Adult patients may take over the counter up to 3000mg per day (if no history of liver problems and if you are not on any Tylenol containing medication such as Tylenol #3, Vicodin, or Percocet).  o MOTRIN: Take as prescribed unless you have undergone a fusion or fracture fixation.  o  OXYCONTIN: Long acting narcotic for pain taken every 12 hours for the first 72 hours after surgery.  o OXYCODONE: Short acting narcotic for pain taken every 3 to 4 hours for most patients.  Patients are encouraged to wean off after the first two weeks after surgery to an over the counter pain medication.  o VISTARIL: An antihistamine useful for nausea and itching due to narcotics. Take every 4 hours with your narcotic.    o ZOFRAN: Take for nausea.  . Elevation is the key! Elevate in bed using atleast two pillows above heart level for the first 72 hours  . Limit your activity in the first 72 hours: BED/BATHROOM/BRIEFLY KITCHEN     BLOOD CLOT PREVENTION  . Unless otherwise directed (if you have a history of blood clots or bleeding risk), all patients should take an 81 mg aspirin daily for 1 month beginning the first morning after surgery.    . Perform calf and ankle pumps, buttock squeezes, and flex and extend toes several times every hour while awake.  . If you notice the onset of calf pain, new or worsening swelling in the foot or ankle after the first few 72 hours, or excessive shortness of breath, please call the office or proceed to your local emergency room for an  ultrasound.    RED FLAGS   . A low grade fever (<102) in the first 2 to 3 days after surgery is normal.  This is most often due to some mild congestion in the lung. You may remedy this with Tylenol and perform deep breathing exercises, 10x every hour while awake.  . If a fever persists after 72 hours, is high grade (>102), or is accompanied with excessive drowsiness please call the office immediately or proceed to an emergency department.  . Excessive or foul smelling drainage from the operated area, please notify the office.

## 2017-05-13 NOTE — H&P (Signed)
ADMISSION HISTORY AND PHYSICAL EXAM    Date Time: 05/13/17 1:41 PM  Patient Name: Gwendolyn [name],Gwendolyn [name]  Attending Physician: Kathi Der, MD    Assessment:   Right peroneal instability    Plan:   Tendon repair and groove deepening    History of Present Illness:   Gwendolyn [name] is a 56 y.o. female who presents to the hospital for outpatient surgery    Past Medical History:     Past Medical History:   Diagnosis Date   . Anxiety    . Arrhythmia     Occasional SVT. (Happens rarely)    . Arthritis    . Calculus of kidney    . Depression    . Headache    . Health care maintenance very nice patient     Dr Fayrene Helper husband is my patient  Colo done 2015 due 2020 Pap 2015  Mammogram 2015   . Herpes    . Hypothyroidism    . Low back pain    . Ovarian cyst    . Post-operative nausea and vomiting     APFEL 3, after last laparoscopy woke up w/ SEVERE pain   . Procreative management    . SVT (supraventricular tachycardia)        Past Surgical History:     Past Surgical History:   Procedure Laterality Date   . COLONOSCOPY     . FOOT SURGERY     . LAPAROSCOPIC, APPENDECTOMY N/A 07/13/2015    Procedure: LAPAROSCOPIC, APPENDECTOMY;  Surgeon: Delena Serve, MD;  Location: [facility];  Service: General;  Laterality: N/A;   . LAPAROSCOPIC, ENTEROLYSIS N/A 07/13/2015    Procedure: LAPAROSCOPIC, LYSIS OF ADHESIONS;  Surgeon: Delena Serve, MD;  Location: [facility];  Service: General;  Laterality: N/A;   . LAPAROSCOPIC, LYSIS, ADHESIONS N/A 11/22/2014    Procedure: LAPAROSCOPIC, LYSIS, ADHESIONS;  Surgeon: Waldon Merl, MD;  Location: [facility];  Service: Gynecology;  Laterality: N/A;   . LAPAROSCOPIC, OPERATIVE N/A 11/22/2014    Procedure: LAPAROSCOPIC OVARIAN CYSTOTOMY;  Surgeon: Waldon Merl, MD;  Location: [facility];  Service: Gynecology;  Laterality: N/A;   . LAPAROSCOPIC, OPERATIVE N/A 07/13/2015    Procedure: LAPAROSCOPIC  SALPINGO - OOPHORECTOMY;  Surgeon: Ronalee Red, MD;  Location: [facility];   Service: Gynecology;  Laterality: N/A;   . LAPAROSCOPY, DIAGNOSTIC N/A 07/13/2015    Procedure: LAPAROSCOPY, DIAGNOSTIC;  Surgeon: Delena Serve, MD;  Location: [facility];  Service: General;  Laterality: N/A;   . REDUCTION MAMMAPLASTY     . TONSILLECTOMY         Family History:     Family History   Problem Relation Age of Onset   . Hyperlipidemia Mother    . Hyperlipidemia Father    . Skin cancer Sister    . Breast cancer Sister 26   . Diabetes Sister    . Hyperlipidemia Sister    . Heart attack Brother    . Hyperlipidemia Brother    . Breast cancer Maternal Aunt 54   . Cervical cancer Maternal Aunt    . Lung cancer Maternal Uncle    . Brain cancer Paternal Uncle    . Diabetes Maternal Grandmother    . Diabetes Paternal Grandfather    . Cancer Cousin    . Colon cancer Neg Hx    . Ovarian cancer Neg Hx        Social History:  Social History     Social History   . Marital status: Married     Spouse name: N/A   . Number of children: N/A   . Years of education: N/A     Social History Main Topics   . Smoking status: Never Smoker   . Smokeless tobacco: Never Used   . Alcohol use 0.6 oz/week     1 Standard drinks or equivalent per week      Comment: rare   . Drug use: No   . Sexual activity: Yes     Partners: Male     Birth control/ protection: None     Other Topics Concern   . Not on file     Social History Narrative   . No narrative on file       Allergies:     Allergies   Allergen Reactions   . Beef-Derived Products Anaphylaxis     And Pork products   . Erythromycin Hives   . Other Swelling and Respiratory Distress     Bee sting    . Penicillins Hives       Medications:     Prescriptions Prior to Admission   Medication Sig   . acetaZOLAMIDE (DIAMOX) 125 MG tablet Take 125 mg by mouth 3 (three) times daily. Takes as needed. Last dose about 3 months ago. Takes for dizziness.     Mack Guise THYROID 30 MG tablet TAKE 1 TABLET BY MOUTH DAILY   . buPROPion XL (WELLBUTRIN XL) 150 MG 24 hr tablet TAKE 1 TABLET BY MOUTH  DAILY   . DUEXIS 800-26.6 MG Tab    . oxyCODONE (ROXICODONE) 5 MG immediate release tablet Take 5 mg by mouth every 4 (four) hours as needed.       Marland Kitchen tiZANidine (ZANAFLEX) 4 MG tablet Take 4 mg by mouth as needed.      . valacyclovir (VALTREX) 1000 MG tablet Take 2 tablets (2,000 mg total) by mouth 2 (two) times daily. (Patient taking differently: Take 2,000 mg by mouth as needed.    )   . verapamil (CALAN) 40 MG tablet Take 40 mg by mouth as needed. As needed for SVT     . ALPRAZolam (XANAX) 0.5 MG tablet Take 1 tablet (0.5 mg total) by mouth as needed for Anxiety.   . Eszopiclone 3 MG tablet Take 1 tablet (3 mg total) by mouth nightly.Take immediately before bedtime       Review of Systems:   A comprehensive review of systems was: Negative except back pain chronic, right ankle pain    Physical Exam:     Vitals:    05/13/17 1126   BP: 131/80   Pulse: 83   Resp: 17   Temp: 97.2 F (36.2 C)   SpO2: 96%       Intake and Output Summary (Last 24 hours) at Date Time  No intake or output data in the 24 hours ending 05/13/17 1341    General appearance - alert, well appearing, and in no distress  Mental status - alert, oriented to person, place, and time  Eyes - sclera anicteric  Chest - no tachypnea, retractions or cyanosis  Back exam - not examined  Neurological - alert, oriented, normal speech, no focal findings or movement disorder noted  Musculoskeletal - no joint tenderness, deformity or swelling  Extremities - peripheral pulses normal, no pedal edema, no clubbing or cyanosis  Skin - normal coloration and turgor, no rashes, no suspicious skin  lesions noted    Labs:     Results     ** No results found for the last 24 hours. **          Rads:   Radiological Procedure reviewed.    Signed by: Kathi Der

## 2017-05-14 ENCOUNTER — Encounter: Payer: Self-pay | Admitting: Orthopaedic Surgery

## 2017-05-14 NOTE — Op Note (Signed)
Procedure Date: 05/13/2017     Patient Type: A     SURGEON: Sabino Dick MD  ASSISTANT:  Daryl Eastern Detrick PA     PREOPERATIVE DIAGNOSES:  1.  Right peroneal tendon instability.  2.  Peroneal brevis tear.     POSTOPERATIVE DIAGNOSES:  1.  Right peroneal tendon instability.  2.  Peroneal brevis tear.     TITLE OF PROCEDURE:  1.  Right peroneal brevis repair and tenosynovectomy.  2.  Right fibula groove deepening procedure (fibula osteotomy).     TOURNIQUET TIME:  45 minutes.     IMPLANTS:  None.     COMPLICATIONS:  None.     ESTIMATED BLOOD LOSS:  Less than 5 mL.     INDICATIONS FOR PROCEDURE:  [name] is a 56 year old female.  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 Her symptoms started secondary to a traumatic ankle  sprain over 1 year ago.  Clinical workup revealed a significant swelling  along the peroneal tendons and partial subluxation of the peroneal tendons  out of the groove.  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  She has failed  extensive nonoperative measures before seeing me, including various trials  of boot immobilization, bracing, physical therapy, activity modification.   She continues to have pain.  Risks and benefits were discussed with her and  has consented for surgery.     DESCRIPTION OF PROCEDURE:  The patient was met in the preoperative holding area where the site was  marked and informed consent was obtained.  She was then brought into the  operating room and placed supine on the operating room table.  At this  time, anesthesia team initiated general endotracheal anesthesia.  Following  this, a formal surgical timeout was then performed confirming the operative  site and the receipt of preoperative antibiotics.  At this time, a right  ankle block was then performed using a combination of 1% lidocaine plain  and 0.25% Marcaine  plain.  At this time, the extremity was then prepped and  draped in standard fashion.  The limb was exsanguinated and a thigh  tourniquet was insufflated to 300 mmHg.  A curvilinear incision was made  just off the posterior border of the fibula and heading down the course of  the peroneal tendon.  Sharp dissection was taken down through skin with  blunt dissection through the subcutaneous tissue.  The sural nerve was visualized  in the distal portion of the wound and was retracted.  The peroneal tendon  sheath was incised off of the posterior border.  This was carried down to  release the superficial peroneal retinaculum and also to open the sheath in  the inframalleolar region.  The peroneal brevis was grossly enlarged.   There was a significant amount of tenosynovitis and a low-lying muscle  belly of the peroneal brevis.  The volume of the peroneal tendon base was  greatly diminished.  The groove was inspected and was also found to be  quite shallow and the superficial peroneal retinaculum was significantly  attenuated.  Following this, the peroneal brevis was then debrided of all  inflamed tissue.  The low-lying muscle belly was also excised from the area  of the retromalleolar space.  At the area of the peroneal groove, there was  Significant  cystic changes in the tendon.  The  tendon was quite bulbous at this level.  This  comprised about 50% of the tendon diameter.  This area was excised along with a  portion of the tendon proximal and distal to this. Once this area was excised, the remaining tendon remained in  good health and about 50% of the tendon was left.  The tendon was then  tubularized using a buried 3-0 Monocryl baseball stitch.  The peroneal  longus then was inspected and was not found to have any significant  pathology, except for mild tenosynovitis .  The peroneal tendons were  located back behind the groove and were able to be easily subluxed out  considering how shallow it was, so decision was  made to proceed with the  groove deepening portion of the case.  Using an oscillating saw, a cortical  window of about 2 cm x 1 cm was created off of the retromalleolar area of  the distal fibula.  This was then hinged open.  The underlying cancellous  bone was then removed using a curette and a rongeur, nicely decompressing  this area.  The cortical window was then reapproximated and was pinned down  with a nice recreation of a groove.  There was a portion distally that had  a portion of raw cancellous bone.  This was then covered with a light layer  of bone wax.  The wound was then copiously irrigated, the peroneal tendons  were relocated sitting nicely behind the groove.  Following this, a 0.062  K-wire was used to create holes along the area of the attachment of the  superficial peroneal retinaculum and the area of the distal fibula.  Using  a #2 Ethibond, the SPR was then brought back up to the fibula in a  horizontal mattress type fashion through the drill holes.  There were a total  of 4 drill holes that were placed.  The remaining of the retinaculum was  then further reinforced using interrupted 2-0 Ethibond.  Following this,  FPR was found to be nicely recreated.  The remaining of the peroneal tendon  sheath was left open proximally.  The distal portion was loosely  approximated.  Following this, tourniquet was let down.  There was no  significant bleeding.  The wound was copiously irrigated.  It was closed in  layered fashion using 3-0 Monocryl to close up the tissue and 4-0 Monocryl  in a running fashion was used to close the skin.  A bulky Jones splint was  then applied after the dressing.  The patient was then awakened from  anesthesia.  She was transferred to the PACU in stable condition.  At the  end of the case, all counts were found to be correct and there were no  complications.           D:  05/13/2017 19:45 PM by Dr. Maren [name]. Gwendolyn Ped, MD ([id number])  T:  05/14/2017 11:28 AM by NTS      Gwendolyn [name]: [id number])  (Doc ID: [id number])

## 2017-05-18 ENCOUNTER — Other Ambulatory Visit (INDEPENDENT_AMBULATORY_CARE_PROVIDER_SITE_OTHER): Payer: Self-pay | Admitting: Internal Medicine

## 2017-05-19 ENCOUNTER — Encounter (INDEPENDENT_AMBULATORY_CARE_PROVIDER_SITE_OTHER): Payer: Self-pay | Admitting: Internal Medicine

## 2017-05-19 NOTE — Telephone Encounter (Signed)
Sent to dr desai

## 2017-05-20 ENCOUNTER — Other Ambulatory Visit (INDEPENDENT_AMBULATORY_CARE_PROVIDER_SITE_OTHER): Payer: Self-pay | Admitting: Internal Medicine

## 2017-05-23 ENCOUNTER — Encounter (INDEPENDENT_AMBULATORY_CARE_PROVIDER_SITE_OTHER): Payer: Self-pay | Admitting: Internal Medicine

## 2017-05-25 ENCOUNTER — Other Ambulatory Visit (INDEPENDENT_AMBULATORY_CARE_PROVIDER_SITE_OTHER): Payer: Self-pay | Admitting: Internal Medicine

## 2017-05-25 DIAGNOSIS — Z8619 Personal history of other infectious and parasitic diseases: Secondary | ICD-10-CM

## 2017-05-25 MED ORDER — VALACYCLOVIR HCL 1 G PO TABS
2000.0000 mg | ORAL_TABLET | Freq: Two times a day (BID) | ORAL | 3 refills | Status: DC
Start: 2017-05-25 — End: 2017-12-12

## 2017-06-02 ENCOUNTER — Encounter (INDEPENDENT_AMBULATORY_CARE_PROVIDER_SITE_OTHER): Payer: BLUE CROSS/BLUE SHIELD | Admitting: Internal Medicine

## 2017-06-05 ENCOUNTER — Encounter (INDEPENDENT_AMBULATORY_CARE_PROVIDER_SITE_OTHER): Payer: Self-pay | Admitting: Internal Medicine

## 2017-06-15 ENCOUNTER — Other Ambulatory Visit (INDEPENDENT_AMBULATORY_CARE_PROVIDER_SITE_OTHER): Payer: Self-pay | Admitting: Internal Medicine

## 2017-06-27 ENCOUNTER — Encounter (INDEPENDENT_AMBULATORY_CARE_PROVIDER_SITE_OTHER): Payer: Self-pay | Admitting: Internal Medicine

## 2017-07-14 ENCOUNTER — Other Ambulatory Visit (INDEPENDENT_AMBULATORY_CARE_PROVIDER_SITE_OTHER): Payer: Self-pay | Admitting: Internal Medicine

## 2017-08-14 ENCOUNTER — Other Ambulatory Visit (INDEPENDENT_AMBULATORY_CARE_PROVIDER_SITE_OTHER): Payer: Self-pay | Admitting: Internal Medicine

## 2017-08-20 ENCOUNTER — Encounter (INDEPENDENT_AMBULATORY_CARE_PROVIDER_SITE_OTHER): Payer: Self-pay | Admitting: Internal Medicine

## 2017-08-27 ENCOUNTER — Encounter (INDEPENDENT_AMBULATORY_CARE_PROVIDER_SITE_OTHER): Payer: Self-pay | Admitting: Internal Medicine

## 2017-09-01 ENCOUNTER — Ambulatory Visit (INDEPENDENT_AMBULATORY_CARE_PROVIDER_SITE_OTHER): Payer: No Typology Code available for payment source | Admitting: Internal Medicine

## 2017-09-01 VITALS — BP 126/80 | HR 76 | Temp 98.1°F | Resp 18 | Ht 68.0 in | Wt 231.0 lb

## 2017-09-01 DIAGNOSIS — Z0000 Encounter for general adult medical examination without abnormal findings: Secondary | ICD-10-CM

## 2017-09-01 DIAGNOSIS — F5101 Primary insomnia: Secondary | ICD-10-CM

## 2017-09-01 MED ORDER — ESZOPICLONE 3 MG PO TABS
3.0000 mg | ORAL_TABLET | Freq: Every evening | ORAL | 0 refills | Status: AC
Start: 2017-09-01 — End: ?

## 2017-09-01 NOTE — Addendum Note (Signed)
Addended by: Shea Evans on: 09/01/2017 02:17 PM     Modules accepted: Orders

## 2017-09-01 NOTE — Progress Notes (Signed)
56 year old in for cpe Subjective:      Patient ID: Gwendolyn [name] is a 56 y.o. female.    Chief Complaint:  Chief Complaint   Patient presents with   . Annual Exam     not fasting       HPI:  56 year old in for cpe           Problem List:  Patient Active Problem List   Diagnosis   . Right ovarian cyst   . Pain, female pelvic   . Obesity (BMI 30-39.9)       Current Medications:  Current Outpatient Prescriptions   Medication Sig Dispense Refill   . ARMOUR THYROID 30 MG tablet TAKE 1 TABLET BY MOUTH DAILY 30 tablet 0   . buPROPion XL (WELLBUTRIN XL) 150 MG 24 hr tablet TAKE 1 TABLET BY MOUTH DAILY 30 tablet 0   . DUEXIS 800-26.6 MG Tab      . tiZANidine (ZANAFLEX) 4 MG tablet Take 4 mg by mouth as needed.     1   . valacyclovir (VALTREX) 1000 MG tablet Take 2 tablets (2,000 mg total) by mouth 2 (two) times daily. 20 tablet 3   . Eszopiclone 3 MG tablet Take 1 tablet (3 mg total) by mouth nightly.Take immediately before bedtime 30 tablet 0     No current facility-administered medications for this visit.        Allergies:  Allergies   Allergen Reactions   . Beef-Derived Products Anaphylaxis     And Pork products   . Erythromycin Hives   . Other Swelling and Respiratory Distress     Bee sting    . Penicillins Hives       Past Medical History:  Past Medical History:   Diagnosis Date   . Anxiety    . Arrhythmia     Occasional SVT. (Happens rarely)    . Arthritis    . Calculus of kidney    . Depression    . Headache    . Health care maintenance very nice patient     Dr Fayrene Helper husband is my patient  Colo done 2015 due 2020 Pap 2015  Mammogram 2015   . Herpes    . Hypothyroidism    . Low back pain    . Ovarian cyst    . Post-operative nausea and vomiting     APFEL 3, after last laparoscopy woke up w/ SEVERE pain   . Procreative management    . SVT (supraventricular tachycardia)        Past Surgical History:  Past Surgical History:   Procedure Laterality Date   . COLONOSCOPY     . FOOT SURGERY     . LAPAROSCOPIC,  APPENDECTOMY N/A 07/13/2015    Procedure: LAPAROSCOPIC, APPENDECTOMY;  Surgeon: Delena Serve, MD;  Location: [facility];  Service: General;  Laterality: N/A;   . LAPAROSCOPIC, ENTEROLYSIS N/A 07/13/2015    Procedure: LAPAROSCOPIC, LYSIS OF ADHESIONS;  Surgeon: Delena Serve, MD;  Location: [facility];  Service: General;  Laterality: N/A;   . LAPAROSCOPIC, LYSIS, ADHESIONS N/A 11/22/2014    Procedure: LAPAROSCOPIC, LYSIS, ADHESIONS;  Surgeon: Waldon Merl, MD;  Location: [facility];  Service: Gynecology;  Laterality: N/A;   . LAPAROSCOPIC, OPERATIVE N/A 11/22/2014    Procedure: LAPAROSCOPIC OVARIAN CYSTOTOMY;  Surgeon: Waldon Merl, MD;  Location: [facility];  Service: Gynecology;  Laterality: N/A;   . LAPAROSCOPIC, OPERATIVE N/A 07/13/2015  Procedure: LAPAROSCOPIC  SALPINGO - OOPHORECTOMY;  Surgeon: Ronalee Red, MD;  Location: [facility];  Service: Gynecology;  Laterality: N/A;   . LAPAROSCOPY, DIAGNOSTIC N/A 07/13/2015    Procedure: LAPAROSCOPY, DIAGNOSTIC;  Surgeon: Delena Serve, MD;  Location: [facility];  Service: General;  Laterality: N/A;   . REDUCTION MAMMAPLASTY     . REPAIR, LOWER EXTREMITY TENDON Right 05/13/2017    Procedure: RIGHT PERONEAL BREVIS REPAIR AND TENODESIS; RIGHT FIBULA GROOVE DEEPENING.;  Surgeon: Kathi Der, MD;  Location: [facility];  Service: Orthopedics;  Laterality: Right;   . TONSILLECTOMY         Family History:  Family History   Problem Relation Age of Onset   . Hyperlipidemia Mother    . Hyperlipidemia Father    . Skin cancer Sister    . Breast cancer Sister 8   . Diabetes Sister    . Hyperlipidemia Sister    . Heart attack Brother    . Hyperlipidemia Brother    . Breast cancer Maternal Aunt 54   . Cervical cancer Maternal Aunt    . Lung cancer Maternal Uncle    . Brain cancer Paternal Uncle    . Diabetes Maternal Grandmother    . Diabetes Paternal Grandfather    . Cancer Cousin    . Colon cancer Neg Hx    . Ovarian cancer Neg Hx         Social History:  Social History     Social History   . Marital status: Married     Spouse name: N/A   . Number of children: N/A   . Years of education: N/A     Occupational History   . Not on file.     Social History Main Topics   . Smoking status: Never Smoker   . Smokeless tobacco: Never Used   . Alcohol use 0.6 oz/week     1 Standard drinks or equivalent per week      Comment: rare   . Drug use: No   . Sexual activity: Yes     Partners: Male     Birth control/ protection: None     Other Topics Concern   . Not on file     Social History Narrative   . No narrative on file       The following sections were reviewed this encounter by the provider:        ROS:  Review of Systems   Constitutional: Negative for activity change, appetite change, fatigue and unexpected weight change.   HENT: Negative for congestion, ear pain, hearing loss, postnasal drip, sinus pressure and sore throat.    Eyes: Negative for pain and visual disturbance.   Respiratory: Negative for cough, chest tightness, shortness of breath and wheezing.    Cardiovascular: Negative for chest pain and palpitations.   Gastrointestinal: Negative for abdominal pain, blood in stool, constipation, diarrhea and nausea.   Genitourinary: Negative for decreased urine volume, difficulty urinating, dyspareunia, frequency, hematuria, menstrual problem, pelvic pain, vaginal bleeding, vaginal discharge and vaginal pain.   Musculoskeletal: Negative for arthralgias and myalgias.   Skin: Negative for color change and rash.   Neurological: Negative for dizziness, weakness, numbness and headaches.   Hematological: Negative for adenopathy.   Psychiatric/Behavioral: Negative for agitation, behavioral problems, self-injury, sleep disturbance and suicidal ideas.       Vitals:  BP 126/80   Pulse 76   Temp 98.1 F (36.7 C) (Oral)  Resp 18   Ht 1.727 m (5\' 8" )   Wt 104.8 kg (231 lb)   SpO2 97%   BMI 35.12 kg/m      Objective:     Physical Exam:  Physical Exam    Constitutional: She appears well-developed and well-nourished.   HENT:   Head: Normocephalic and atraumatic.   Right Ear: External ear normal.   Left Ear: External ear normal.   Nose: Nose normal.   Mouth/Throat: Oropharynx is clear and moist. No oropharyngeal exudate.   Eyes: Conjunctivae and EOM are normal.   Neck: Normal range of motion. Neck supple. No JVD present. No thyromegaly present.   Cardiovascular: Normal rate, regular rhythm, normal heart sounds and intact distal pulses.    Pulmonary/Chest: Effort normal and breath sounds normal.   Abdominal: Soft. Bowel sounds are normal. She exhibits no mass. There is no tenderness. There is no rebound.   Musculoskeletal: Normal range of motion. She exhibits no edema.   Lymphadenopathy:     She has no cervical adenopathy.   Neurological: She has normal reflexes.   Skin: Skin is warm and dry.   Psychiatric: She has a normal mood and affect. Her behavior is normal.   Nursing note and vitals reviewed.       Assessment:     There are no diagnoses linked to this encounter.    Plan:     56 year old in for cpe  Counseling/Anticipatory Guidance:  nutrition, family planning/contraception, physical activity, healthy weight, injury prevention, misuse of tobacco, alcohol and drugs, sexual behavior and STDs, dental health, mental health, immunizations, screenings  Breast cancer and self breast exams    Othar Curto Angelene Giovanni, MD

## 2017-09-12 ENCOUNTER — Encounter (INDEPENDENT_AMBULATORY_CARE_PROVIDER_SITE_OTHER): Payer: Self-pay | Admitting: Internal Medicine

## 2017-09-12 ENCOUNTER — Encounter (INDEPENDENT_AMBULATORY_CARE_PROVIDER_SITE_OTHER): Payer: Self-pay

## 2017-09-12 NOTE — Progress Notes (Signed)
Prior authorization was initiated and approved for Eszopiclone 3mg  for 08/13/17 to 09/11/20

## 2017-09-14 ENCOUNTER — Other Ambulatory Visit (INDEPENDENT_AMBULATORY_CARE_PROVIDER_SITE_OTHER): Payer: Self-pay | Admitting: Internal Medicine

## 2017-09-15 ENCOUNTER — Encounter (INDEPENDENT_AMBULATORY_CARE_PROVIDER_SITE_OTHER): Payer: Self-pay | Admitting: Internal Medicine

## 2017-09-23 ENCOUNTER — Encounter (INDEPENDENT_AMBULATORY_CARE_PROVIDER_SITE_OTHER): Payer: Self-pay | Admitting: Internal Medicine

## 2017-09-25 ENCOUNTER — Other Ambulatory Visit (INDEPENDENT_AMBULATORY_CARE_PROVIDER_SITE_OTHER): Payer: Self-pay | Admitting: Internal Medicine

## 2017-09-25 DIAGNOSIS — E039 Hypothyroidism, unspecified: Secondary | ICD-10-CM

## 2017-09-25 MED ORDER — LEVOTHYROXINE SODIUM 50 MCG PO TABS
50.0000 ug | ORAL_TABLET | Freq: Every day | ORAL | 5 refills | Status: DC
Start: 2017-09-25 — End: 2017-12-16

## 2017-09-30 ENCOUNTER — Encounter (INDEPENDENT_AMBULATORY_CARE_PROVIDER_SITE_OTHER): Payer: Self-pay | Admitting: Internal Medicine

## 2017-10-12 ENCOUNTER — Other Ambulatory Visit (INDEPENDENT_AMBULATORY_CARE_PROVIDER_SITE_OTHER): Payer: Self-pay | Admitting: Internal Medicine

## 2017-10-20 ENCOUNTER — Other Ambulatory Visit (INDEPENDENT_AMBULATORY_CARE_PROVIDER_SITE_OTHER): Payer: Self-pay | Admitting: Internal Medicine

## 2017-10-21 ENCOUNTER — Encounter (INDEPENDENT_AMBULATORY_CARE_PROVIDER_SITE_OTHER): Payer: Self-pay | Admitting: Internal Medicine

## 2017-11-26 ENCOUNTER — Other Ambulatory Visit (INDEPENDENT_AMBULATORY_CARE_PROVIDER_SITE_OTHER): Payer: Self-pay | Admitting: Internal Medicine

## 2017-11-28 ENCOUNTER — Other Ambulatory Visit (INDEPENDENT_AMBULATORY_CARE_PROVIDER_SITE_OTHER): Payer: Self-pay

## 2017-12-12 ENCOUNTER — Other Ambulatory Visit (INDEPENDENT_AMBULATORY_CARE_PROVIDER_SITE_OTHER): Payer: Self-pay | Admitting: Internal Medicine

## 2017-12-12 DIAGNOSIS — Z8619 Personal history of other infectious and parasitic diseases: Secondary | ICD-10-CM

## 2017-12-16 ENCOUNTER — Ambulatory Visit (INDEPENDENT_AMBULATORY_CARE_PROVIDER_SITE_OTHER): Payer: No Typology Code available for payment source | Admitting: Internal Medicine

## 2017-12-16 ENCOUNTER — Encounter (INDEPENDENT_AMBULATORY_CARE_PROVIDER_SITE_OTHER): Payer: Self-pay | Admitting: Internal Medicine

## 2017-12-16 VITALS — BP 113/76 | HR 81 | Temp 98.2°F | Ht 64.0 in

## 2017-12-16 DIAGNOSIS — F419 Anxiety disorder, unspecified: Secondary | ICD-10-CM

## 2017-12-16 DIAGNOSIS — K219 Gastro-esophageal reflux disease without esophagitis: Secondary | ICD-10-CM

## 2017-12-16 DIAGNOSIS — Z8619 Personal history of other infectious and parasitic diseases: Secondary | ICD-10-CM

## 2017-12-16 MED ORDER — BUPROPION HCL ER (XL) 300 MG PO TB24
300.0000 mg | ORAL_TABLET | Freq: Every day | ORAL | 1 refills | Status: DC
Start: 2017-12-16 — End: 2018-02-11

## 2017-12-16 MED ORDER — VALACYCLOVIR HCL 1 G PO TABS
1000.0000 mg | ORAL_TABLET | Freq: Two times a day (BID) | ORAL | 0 refills | Status: DC
Start: 2017-12-16 — End: 2017-12-23

## 2017-12-16 MED ORDER — ALPRAZOLAM 0.5 MG PO TABS
0.5000 mg | ORAL_TABLET | Freq: Three times a day (TID) | ORAL | 0 refills | Status: DC | PRN
Start: 2017-12-16 — End: 2018-09-15

## 2017-12-16 MED ORDER — OMEPRAZOLE 40 MG PO CPDR
40.0000 mg | DELAYED_RELEASE_CAPSULE | Freq: Every day | ORAL | 1 refills | Status: DC
Start: 2017-12-16 — End: 2018-01-20

## 2017-12-16 NOTE — Progress Notes (Signed)
Subjective:      Patient ID: Gwendolyn [name] is a 57 y.o. female.    Chief Complaint:  Chief Complaint   Patient presents with   . Nasal Congestion   . Abdominal Pain       HPI:  She had a cold and had cold sores .        Abdominal Pain   This is a new problem. The problem has been unchanged. The pain is located in the generalized abdominal region. Pertinent negatives include no belching, constipation, diarrhea, flatus, frequency, hematochezia, melena, myalgias, nausea or weight loss.       Problem List:  Patient Active Problem List   Diagnosis   . Right ovarian cyst   . Pain, female pelvic   . Obesity (BMI 30-39.9)       Current Medications:  Current Outpatient Prescriptions   Medication Sig Dispense Refill   . ARMOUR THYROID 30 MG tablet TAKE 1 TABLET BY MOUTH DAILY 30 tablet 0   . buPROPion XL (WELLBUTRIN XL) 150 MG 24 hr tablet TAKE 1 TABLET BY MOUTH DAILY 30 tablet 0   . DUEXIS 800-26.6 MG Tab      . Eszopiclone 3 MG tablet Take 1 tablet (3 mg total) by mouth nightly.Take immediately before bedtime 30 tablet 0   . tiZANidine (ZANAFLEX) 4 MG tablet Take 4 mg by mouth as needed.     1   . valacyclovir (VALTREX) 1000 MG tablet TAKE 2 TABLETS(2000 MG) BY MOUTH TWICE DAILY 20 tablet 0     No current facility-administered medications for this visit.        Allergies:  Allergies   Allergen Reactions   . Beef-Derived Products Anaphylaxis     And Pork products   . Erythromycin Hives   . Other Swelling and Respiratory Distress     Bee sting    . Penicillins Hives       Past Medical History:  Past Medical History:   Diagnosis Date   . Anxiety    . Arrhythmia     Occasional SVT. (Happens rarely)    . Arthritis    . Calculus of kidney    . Depression    . Headache    . Health care maintenance very nice patient     Dr Fayrene Helper husband is my patient  Colo done 2015 due 2020 Pap 2015  Mammogram 2015   . Herpes    . Hypothyroidism    . Low back pain    . Ovarian cyst    . Post-operative nausea and vomiting     APFEL 3, after  last laparoscopy woke up w/ SEVERE pain   . Procreative management    . SVT (supraventricular tachycardia)        Past Surgical History:  Past Surgical History:   Procedure Laterality Date   . COLONOSCOPY     . FOOT SURGERY     . LAPAROSCOPIC, APPENDECTOMY N/A 07/13/2015    Procedure: LAPAROSCOPIC, APPENDECTOMY;  Surgeon: Delena Serve, MD;  Location: [facility];  Service: General;  Laterality: N/A;   . LAPAROSCOPIC, ENTEROLYSIS N/A 07/13/2015    Procedure: LAPAROSCOPIC, LYSIS OF ADHESIONS;  Surgeon: Delena Serve, MD;  Location: [facility];  Service: General;  Laterality: N/A;   . LAPAROSCOPIC, LYSIS, ADHESIONS N/A 11/22/2014    Procedure: LAPAROSCOPIC, LYSIS, ADHESIONS;  Surgeon: Waldon Merl, MD;  Location: [facility];  Service: Gynecology;  Laterality: N/A;   . LAPAROSCOPIC,  OPERATIVE N/A 11/22/2014    Procedure: LAPAROSCOPIC OVARIAN CYSTOTOMY;  Surgeon: Waldon Merl, MD;  Location: [facility];  Service: Gynecology;  Laterality: N/A;   . LAPAROSCOPIC, OPERATIVE N/A 07/13/2015    Procedure: LAPAROSCOPIC  SALPINGO - OOPHORECTOMY;  Surgeon: Ronalee Red, MD;  Location: [facility];  Service: Gynecology;  Laterality: N/A;   . LAPAROSCOPY, DIAGNOSTIC N/A 07/13/2015    Procedure: LAPAROSCOPY, DIAGNOSTIC;  Surgeon: Delena Serve, MD;  Location: [facility];  Service: General;  Laterality: N/A;   . REDUCTION MAMMAPLASTY     . REPAIR, LOWER EXTREMITY TENDON Right 05/13/2017    Procedure: RIGHT PERONEAL BREVIS REPAIR AND TENODESIS; RIGHT FIBULA GROOVE DEEPENING.;  Surgeon: Kathi Der, MD;  Location: [facility];  Service: Orthopedics;  Laterality: Right;   . TONSILLECTOMY         Family History:  Family History   Problem Relation Age of Onset   . Hyperlipidemia Mother    . Hyperlipidemia Father    . Skin cancer Sister    . Breast cancer Sister 41   . Diabetes Sister    . Hyperlipidemia Sister    . Heart attack Brother    . Hyperlipidemia Brother    . Breast cancer Maternal  Aunt 54   . Cervical cancer Maternal Aunt    . Lung cancer Maternal Uncle    . Brain cancer Paternal Uncle    . Diabetes Maternal Grandmother    . Diabetes Paternal Grandfather    . Cancer Cousin    . Colon cancer Neg Hx    . Ovarian cancer Neg Hx        Social History:  Social History     Social History   . Marital status: Married     Spouse name: N/A   . Number of children: N/A   . Years of education: N/A     Occupational History   . Not on file.     Social History Main Topics   . Smoking status: Never Smoker   . Smokeless tobacco: Never Used   . Alcohol use 0.6 oz/week     1 Standard drinks or equivalent per week      Comment: rare   . Drug use: No   . Sexual activity: Yes     Partners: Male     Birth control/ protection: None     Other Topics Concern   . Not on file     Social History Narrative   . No narrative on file       The following sections were reviewed this encounter by the provider:   Tobacco  Med Hx  Surg Hx  Fam Hx  Soc Hx        ROS:  Review of Systems   Constitutional: Negative for weight loss.   Gastrointestinal: Positive for abdominal pain. Negative for constipation, diarrhea, flatus, hematochezia, melena and nausea.   Genitourinary: Negative for frequency.   Musculoskeletal: Negative for myalgias.       Vitals:  BP 113/76 (BP Site: Right arm, Patient Position: Sitting, Cuff Size: Small)   Pulse 81   Temp 98.2 F (36.8 C)   Ht 1.626 m (5\' 4" )   BMI 39.65 kg/m      Objective:     Physical Exam:  Physical Exam   Constitutional: She appears well-developed and well-nourished.   Cardiovascular: Normal rate and regular rhythm.    Pulmonary/Chest: Effort normal and breath sounds normal.  Abdominal: Soft. Bowel sounds are normal.   Skin: Skin is warm and dry.   Nursing note and vitals reviewed.       Assessment:     57 year old in for abdominal pain with hisstory of ulcers    Plan:     1. PUD/ GERD  rx prilosec  Refer to GI     2. Cold sore  rx valtrex    Amari Zagal Angelene Giovanni, MD

## 2017-12-16 NOTE — Addendum Note (Signed)
Addended by: Shea Evans on: 12/16/2017 01:47 PM     Modules accepted: Orders, Level of Service

## 2017-12-16 NOTE — Progress Notes (Signed)
Subjective:      Patient ID: Gwendolyn [name] is a 57 y.o. female.    Chief Complaint:  Chief Complaint   Patient presents with   . Nasal Congestion   . Abdominal Pain   . Anxiety       HPI:  Abdominal Pain     Anxiety   Presents for follow-up visit. Symptoms include excessive worry and nervous/anxious behavior. Patient reports no chest pain, compulsions, depressed mood, feeling of choking, hyperventilation, impotence, insomnia, malaise, muscle tension, obsessions, panic, restlessness or suicidal ideas.           Problem List:  Patient Active Problem List   Diagnosis   . Right ovarian cyst   . Pain, female pelvic   . Obesity (BMI 30-39.9)       Current Medications:  Current Outpatient Prescriptions   Medication Sig Dispense Refill   . ARMOUR THYROID 30 MG tablet TAKE 1 TABLET BY MOUTH DAILY 30 tablet 0   . buPROPion XL (WELLBUTRIN XL) 150 MG 24 hr tablet TAKE 1 TABLET BY MOUTH DAILY 30 tablet 0   . DUEXIS 800-26.6 MG Tab      . Eszopiclone 3 MG tablet Take 1 tablet (3 mg total) by mouth nightly.Take immediately before bedtime 30 tablet 0   . tiZANidine (ZANAFLEX) 4 MG tablet Take 4 mg by mouth as needed.     1   . valacyclovir (VALTREX) 1000 MG tablet Take 1 tablet (1,000 mg total) by mouth 2 (two) times daily. 20 tablet 0   . omeprazole (PRILOSEC) 40 MG capsule Take 1 capsule (40 mg total) by mouth daily. 30 capsule 1     No current facility-administered medications for this visit.        Allergies:  Allergies   Allergen Reactions   . Beef-Derived Products Anaphylaxis     And Pork products   . Erythromycin Hives   . Other Swelling and Respiratory Distress     Bee sting    . Penicillins Hives       Past Medical History:  Past Medical History:   Diagnosis Date   . Anxiety    . Arrhythmia     Occasional SVT. (Happens rarely)    . Arthritis    . Calculus of kidney    . Depression    . Headache    . Health care maintenance very nice patient     Dr Fayrene Helper husband is my patient  Colo done 2015 due 2020 Pap 2015  Mammogram  2015   . Herpes    . Hypothyroidism    . Low back pain    . Ovarian cyst    . Post-operative nausea and vomiting     APFEL 3, after last laparoscopy woke up w/ SEVERE pain   . Procreative management    . SVT (supraventricular tachycardia)        Past Surgical History:  Past Surgical History:   Procedure Laterality Date   . COLONOSCOPY     . FOOT SURGERY     . LAPAROSCOPIC, APPENDECTOMY N/A 07/13/2015    Procedure: LAPAROSCOPIC, APPENDECTOMY;  Surgeon: Delena Serve, MD;  Location: [facility];  Service: General;  Laterality: N/A;   . LAPAROSCOPIC, ENTEROLYSIS N/A 07/13/2015    Procedure: LAPAROSCOPIC, LYSIS OF ADHESIONS;  Surgeon: Delena Serve, MD;  Location: [facility];  Service: General;  Laterality: N/A;   . LAPAROSCOPIC, LYSIS, ADHESIONS N/A 11/22/2014    Procedure: LAPAROSCOPIC, LYSIS, ADHESIONS;  Surgeon: Waldon Merl, MD;  Location: [facility];  Service: Gynecology;  Laterality: N/A;   . LAPAROSCOPIC, OPERATIVE N/A 11/22/2014    Procedure: LAPAROSCOPIC OVARIAN CYSTOTOMY;  Surgeon: Waldon Merl, MD;  Location: [facility];  Service: Gynecology;  Laterality: N/A;   . LAPAROSCOPIC, OPERATIVE N/A 07/13/2015    Procedure: LAPAROSCOPIC  SALPINGO - OOPHORECTOMY;  Surgeon: Ronalee Red, MD;  Location: [facility];  Service: Gynecology;  Laterality: N/A;   . LAPAROSCOPY, DIAGNOSTIC N/A 07/13/2015    Procedure: LAPAROSCOPY, DIAGNOSTIC;  Surgeon: Delena Serve, MD;  Location: [facility];  Service: General;  Laterality: N/A;   . REDUCTION MAMMAPLASTY     . REPAIR, LOWER EXTREMITY TENDON Right 05/13/2017    Procedure: RIGHT PERONEAL BREVIS REPAIR AND TENODESIS; RIGHT FIBULA GROOVE DEEPENING.;  Surgeon: Kathi Der, MD;  Location: [facility];  Service: Orthopedics;  Laterality: Right;   . TONSILLECTOMY         Family History:  Family History   Problem Relation Age of Onset   . Hyperlipidemia Mother    . Hyperlipidemia Father    . Skin cancer Sister    . Breast cancer Sister 70    . Diabetes Sister    . Hyperlipidemia Sister    . Heart attack Brother    . Hyperlipidemia Brother    . Breast cancer Maternal Aunt 54   . Cervical cancer Maternal Aunt    . Lung cancer Maternal Uncle    . Brain cancer Paternal Uncle    . Diabetes Maternal Grandmother    . Diabetes Paternal Grandfather    . Cancer Cousin    . Colon cancer Neg Hx    . Ovarian cancer Neg Hx        Social History:  Social History     Social History   . Marital status: Married     Spouse name: N/A   . Number of children: N/A   . Years of education: N/A     Occupational History   . Not on file.     Social History Main Topics   . Smoking status: Never Smoker   . Smokeless tobacco: Never Used   . Alcohol use 0.6 oz/week     1 Standard drinks or equivalent per week      Comment: rare   . Drug use: No   . Sexual activity: Yes     Partners: Male     Birth control/ protection: None     Other Topics Concern   . Not on file     Social History Narrative   . No narrative on file       The following sections were reviewed this encounter by the provider:   Tobacco  Meds  Problems  Med Hx  Surg Hx  Fam Hx  Soc Hx        ROS:  Review of Systems   Cardiovascular: Negative for chest pain.   Gastrointestinal: Positive for abdominal pain.   Genitourinary: Negative for impotence.   Psychiatric/Behavioral: Negative for suicidal ideas. The patient is nervous/anxious. The patient does not have insomnia.        Vitals:  BP 113/76 (BP Site: Right arm, Patient Position: Sitting, Cuff Size: Small)   Pulse 81   Temp 98.2 F (36.8 C)   Ht 1.626 m (5\' 4" )   BMI 39.65 kg/m      Objective:     Physical Exam:  Physical Exam  Constitutional: She appears well-developed and well-nourished.   Cardiovascular: Normal rate and regular rhythm.    Pulmonary/Chest: Effort normal and breath sounds normal.   Abdominal: Soft. Bowel sounds are normal.   Skin: Skin is warm and dry.   Nursing note and vitals reviewed.       Assessment:     1. Gastroesophageal reflux  disease, esophagitis presence not specified  - Ambulatory referral to Gastroenterology  - omeprazole (PRILOSEC) 40 MG capsule; Take 1 capsule (40 mg total) by mouth daily.  Dispense: 30 capsule; Refill: 1    2. History of cold sores  - valacyclovir (VALTREX) 1000 MG tablet; Take 1 tablet (1,000 mg total) by mouth 2 (two) times daily.  Dispense: 20 tablet; Refill: 0      Plan:     3. Anxiety   rx xanax   Risk & Benefits of the new medication(s) were explained to the pt (and family) who appeared to understand & agree to the treatment plan.    Anjelo Pullman Angelene Giovanni, MD

## 2017-12-16 NOTE — Addendum Note (Signed)
Addended by: Shea Evans on: 12/16/2017 01:50 PM     Modules accepted: Orders

## 2017-12-17 ENCOUNTER — Other Ambulatory Visit (INDEPENDENT_AMBULATORY_CARE_PROVIDER_SITE_OTHER): Payer: Self-pay

## 2017-12-17 MED ORDER — THYROID 30 MG PO TABS
30.0000 mg | ORAL_TABLET | Freq: Every day | ORAL | 0 refills | Status: DC
Start: 2017-12-17 — End: 2018-01-03

## 2017-12-19 ENCOUNTER — Encounter (INDEPENDENT_AMBULATORY_CARE_PROVIDER_SITE_OTHER): Payer: Self-pay | Admitting: Internal Medicine

## 2017-12-23 ENCOUNTER — Telehealth (INDEPENDENT_AMBULATORY_CARE_PROVIDER_SITE_OTHER): Payer: Self-pay | Admitting: Internal Medicine

## 2017-12-23 ENCOUNTER — Encounter (INDEPENDENT_AMBULATORY_CARE_PROVIDER_SITE_OTHER): Payer: Self-pay | Admitting: Internal Medicine

## 2017-12-23 DIAGNOSIS — Z8619 Personal history of other infectious and parasitic diseases: Secondary | ICD-10-CM

## 2017-12-23 NOTE — Telephone Encounter (Signed)
Please call [facility] regarding the following prescription for patient:    valacyclovir (VALTREX) 1000 MG tablet (Order [id number])     Call back: [phone]  Reference: [id number]

## 2017-12-24 ENCOUNTER — Other Ambulatory Visit (INDEPENDENT_AMBULATORY_CARE_PROVIDER_SITE_OTHER): Payer: Self-pay | Admitting: Internal Medicine

## 2017-12-24 ENCOUNTER — Encounter (INDEPENDENT_AMBULATORY_CARE_PROVIDER_SITE_OTHER): Payer: Self-pay | Admitting: Internal Medicine

## 2017-12-24 MED ORDER — VALACYCLOVIR HCL 1 G PO TABS
1000.0000 mg | ORAL_TABLET | Freq: Two times a day (BID) | ORAL | 0 refills | Status: DC
Start: 2017-12-24 — End: 2018-01-14

## 2018-01-03 ENCOUNTER — Other Ambulatory Visit (INDEPENDENT_AMBULATORY_CARE_PROVIDER_SITE_OTHER): Payer: Self-pay | Admitting: Internal Medicine

## 2018-01-14 ENCOUNTER — Other Ambulatory Visit (INDEPENDENT_AMBULATORY_CARE_PROVIDER_SITE_OTHER): Payer: Self-pay | Admitting: Internal Medicine

## 2018-01-14 ENCOUNTER — Ambulatory Visit (INDEPENDENT_AMBULATORY_CARE_PROVIDER_SITE_OTHER): Payer: No Typology Code available for payment source | Admitting: Internal Medicine

## 2018-01-14 DIAGNOSIS — Z8619 Personal history of other infectious and parasitic diseases: Secondary | ICD-10-CM

## 2018-01-20 ENCOUNTER — Encounter (INDEPENDENT_AMBULATORY_CARE_PROVIDER_SITE_OTHER): Payer: Self-pay | Admitting: Internal Medicine

## 2018-01-20 ENCOUNTER — Ambulatory Visit (INDEPENDENT_AMBULATORY_CARE_PROVIDER_SITE_OTHER): Payer: No Typology Code available for payment source | Admitting: Internal Medicine

## 2018-01-20 VITALS — BP 134/85 | HR 85 | Temp 97.5°F | Ht 64.0 in | Wt 235.0 lb

## 2018-01-20 DIAGNOSIS — J029 Acute pharyngitis, unspecified: Secondary | ICD-10-CM

## 2018-01-20 LAB — POCT RAPID STREP A: Rapid Strep A Screen POCT: NEGATIVE

## 2018-01-20 LAB — POCT INFLUENZA A/B
POCT Rapid Influenza A AG: NEGATIVE
POCT Rapid Influenza B AG: NEGATIVE

## 2018-01-20 NOTE — Progress Notes (Signed)
Subjective:      Patient ID: Gwendolyn [name] is a 57 y.o. female.    Chief Complaint:  Chief Complaint   Patient presents with   . Abdominal Pain       HPI:  Abdominal Pain   This is a recurrent problem. The current episode started more than 1 month ago. Pertinent negatives include no anorexia, arthralgias, constipation, diarrhea, fever, frequency, headaches, hematuria, myalgias, nausea or weight loss.   Sore Throat    This is a new problem. The current episode started today. Associated symptoms include abdominal pain. Pertinent negatives include no congestion, coughing, diarrhea, ear pain, headaches or plugged ear sensation.       Problem List:  Patient Active Problem List   Diagnosis   . Right ovarian cyst   . Pain, female pelvic   . Obesity (BMI 30-39.9)       Current Medications:  Current Outpatient Prescriptions   Medication Sig Dispense Refill   . ALPRAZolam (XANAX) 0.5 MG tablet Take 1 tablet (0.5 mg total) by mouth 3 (three) times daily as needed for Sleep or Anxiety. 30 tablet 0   . ARMOUR THYROID 30 MG tablet TAKE 1 TABLET BY MOUTH DAILY 30 tablet 0   . buPROPion XL (WELLBUTRIN XL) 300 MG 24 hr tablet Take 1 tablet (300 mg total) by mouth daily. 30 tablet 1   . DUEXIS 800-26.6 MG Tab      . tiZANidine (ZANAFLEX) 4 MG tablet Take 4 mg by mouth as needed.     1   . valacyclovir (VALTREX) 1000 MG tablet TAKE 1 TABLET BY MOUTH TWO  TIMES DAILY 20 tablet 0   . Eszopiclone 3 MG tablet Take 1 tablet (3 mg total) by mouth nightly.Take immediately before bedtime 30 tablet 0     No current facility-administered medications for this visit.        Allergies:  Allergies   Allergen Reactions   . Beef-Derived Products Anaphylaxis     And Pork products   . Erythromycin Hives   . Other Swelling and Respiratory Distress     Bee sting    . Penicillins Hives       Past Medical History:  Past Medical History:   Diagnosis Date   . Anxiety    . Arrhythmia     Occasional SVT. (Happens rarely)    . Arthritis    . Calculus of kidney     . Depression    . Headache    . Health care maintenance very nice patient     Dr Fayrene Helper husband is my patient  Colo done 2015 due 2020 Pap 2015  Mammogram 2015   . Herpes    . Hypothyroidism    . Low back pain    . Ovarian cyst    . Post-operative nausea and vomiting     APFEL 3, after last laparoscopy woke up w/ SEVERE pain   . Procreative management    . SVT (supraventricular tachycardia)        Past Surgical History:  Past Surgical History:   Procedure Laterality Date   . COLONOSCOPY     . FOOT SURGERY     . LAPAROSCOPIC, APPENDECTOMY N/A 07/13/2015    Procedure: LAPAROSCOPIC, APPENDECTOMY;  Surgeon: Delena Serve, MD;  Location: [facility];  Service: General;  Laterality: N/A;   . LAPAROSCOPIC, ENTEROLYSIS N/A 07/13/2015    Procedure: LAPAROSCOPIC, LYSIS OF ADHESIONS;  Surgeon: Delena Serve, MD;  Location: [facility];  Service: General;  Laterality: N/A;   . LAPAROSCOPIC, LYSIS, ADHESIONS N/A 11/22/2014    Procedure: LAPAROSCOPIC, LYSIS, ADHESIONS;  Surgeon: Waldon Merl, MD;  Location: [facility];  Service: Gynecology;  Laterality: N/A;   . LAPAROSCOPIC, OPERATIVE N/A 11/22/2014    Procedure: LAPAROSCOPIC OVARIAN CYSTOTOMY;  Surgeon: Waldon Merl, MD;  Location: [facility];  Service: Gynecology;  Laterality: N/A;   . LAPAROSCOPIC, OPERATIVE N/A 07/13/2015    Procedure: LAPAROSCOPIC  SALPINGO - OOPHORECTOMY;  Surgeon: Ronalee Red, MD;  Location: [facility];  Service: Gynecology;  Laterality: N/A;   . LAPAROSCOPY, DIAGNOSTIC N/A 07/13/2015    Procedure: LAPAROSCOPY, DIAGNOSTIC;  Surgeon: Delena Serve, MD;  Location: [facility];  Service: General;  Laterality: N/A;   . REDUCTION MAMMAPLASTY     . REPAIR, LOWER EXTREMITY TENDON Right 05/13/2017    Procedure: RIGHT PERONEAL BREVIS REPAIR AND TENODESIS; RIGHT FIBULA GROOVE DEEPENING.;  Surgeon: Kathi Der, MD;  Location: [facility];  Service: Orthopedics;  Laterality: Right;   . TONSILLECTOMY         Family  History:  Family History   Problem Relation Age of Onset   . Hyperlipidemia Mother    . Hyperlipidemia Father    . Skin cancer Sister    . Breast cancer Sister 10   . Diabetes Sister    . Hyperlipidemia Sister    . Heart attack Brother    . Hyperlipidemia Brother    . Breast cancer Maternal Aunt 54   . Cervical cancer Maternal Aunt    . Lung cancer Maternal Uncle    . Brain cancer Paternal Uncle    . Diabetes Maternal Grandmother    . Diabetes Paternal Grandfather    . Cancer Cousin    . Colon cancer Neg Hx    . Ovarian cancer Neg Hx        Social History:  Social History     Social History   . Marital status: Married     Spouse name: N/A   . Number of children: N/A   . Years of education: N/A     Occupational History   . Not on file.     Social History Main Topics   . Smoking status: Never Smoker   . Smokeless tobacco: Never Used   . Alcohol use 0.6 oz/week     1 Standard drinks or equivalent per week      Comment: rare   . Drug use: No   . Sexual activity: Yes     Partners: Male     Birth control/ protection: None     Other Topics Concern   . Not on file     Social History Narrative   . No narrative on file       The following sections were reviewed this encounter by the provider:   Tobacco  Meds  Med Hx  Surg Hx  Fam Hx  Soc Hx        ROS:  Review of Systems   Constitutional: Negative for fever and weight loss.   HENT: Negative for congestion and ear pain.    Respiratory: Negative for cough.    Gastrointestinal: Positive for abdominal pain. Negative for anorexia, constipation, diarrhea and nausea.   Genitourinary: Negative for frequency and hematuria.   Musculoskeletal: Negative for arthralgias and myalgias.   Neurological: Negative for headaches.       Vitals:  BP 134/85 (BP Site:  Right arm, Patient Position: Sitting, Cuff Size: Medium)   Pulse 85   Temp 97.5 F (36.4 C) (Oral)   Ht 1.626 m (5\' 4" )   Wt 106.6 kg (235 lb)   BMI 40.34 kg/m      Objective:     Physical Exam:  Physical Exam    Constitutional: She appears well-developed and well-nourished.   Cardiovascular: Normal rate and regular rhythm.    Pulmonary/Chest: Effort normal and breath sounds normal.   Abdominal: Soft. Bowel sounds are normal.   Skin: Skin is warm and dry.   Nursing note and vitals reviewed.       Assessment:     57 year old in for constipation and abnodminal pain    Plan:       Is going to GI   Refer colo    URI  syptomatic  Check strep and flu   If not better start meds  Malvina Schadler Angelene Giovanni, MD

## 2018-01-23 ENCOUNTER — Other Ambulatory Visit (INDEPENDENT_AMBULATORY_CARE_PROVIDER_SITE_OTHER): Payer: Self-pay | Admitting: Internal Medicine

## 2018-01-29 ENCOUNTER — Encounter (INDEPENDENT_AMBULATORY_CARE_PROVIDER_SITE_OTHER): Payer: Self-pay | Admitting: Internal Medicine

## 2018-02-11 ENCOUNTER — Other Ambulatory Visit (INDEPENDENT_AMBULATORY_CARE_PROVIDER_SITE_OTHER): Payer: Self-pay | Admitting: Internal Medicine

## 2018-02-11 DIAGNOSIS — F419 Anxiety disorder, unspecified: Secondary | ICD-10-CM

## 2018-03-13 ENCOUNTER — Other Ambulatory Visit (INDEPENDENT_AMBULATORY_CARE_PROVIDER_SITE_OTHER): Payer: Self-pay | Admitting: Internal Medicine

## 2018-03-13 DIAGNOSIS — F419 Anxiety disorder, unspecified: Secondary | ICD-10-CM

## 2018-03-26 ENCOUNTER — Encounter (INDEPENDENT_AMBULATORY_CARE_PROVIDER_SITE_OTHER): Payer: Self-pay | Admitting: Internal Medicine

## 2018-03-27 ENCOUNTER — Encounter (INDEPENDENT_AMBULATORY_CARE_PROVIDER_SITE_OTHER): Payer: Self-pay | Admitting: Internal Medicine

## 2018-04-02 ENCOUNTER — Encounter (INDEPENDENT_AMBULATORY_CARE_PROVIDER_SITE_OTHER): Payer: Self-pay

## 2018-04-13 ENCOUNTER — Other Ambulatory Visit (INDEPENDENT_AMBULATORY_CARE_PROVIDER_SITE_OTHER): Payer: Self-pay | Admitting: Internal Medicine

## 2018-04-13 DIAGNOSIS — F419 Anxiety disorder, unspecified: Secondary | ICD-10-CM

## 2018-05-01 ENCOUNTER — Encounter (INDEPENDENT_AMBULATORY_CARE_PROVIDER_SITE_OTHER): Payer: Self-pay | Admitting: Internal Medicine

## 2018-05-12 ENCOUNTER — Other Ambulatory Visit (INDEPENDENT_AMBULATORY_CARE_PROVIDER_SITE_OTHER): Payer: Self-pay | Admitting: Internal Medicine

## 2018-05-12 DIAGNOSIS — F419 Anxiety disorder, unspecified: Secondary | ICD-10-CM

## 2018-06-08 ENCOUNTER — Other Ambulatory Visit (INDEPENDENT_AMBULATORY_CARE_PROVIDER_SITE_OTHER): Payer: Self-pay | Admitting: Internal Medicine

## 2018-06-08 DIAGNOSIS — F419 Anxiety disorder, unspecified: Secondary | ICD-10-CM

## 2018-07-06 ENCOUNTER — Other Ambulatory Visit (INDEPENDENT_AMBULATORY_CARE_PROVIDER_SITE_OTHER): Payer: Self-pay | Admitting: Internal Medicine

## 2018-07-06 DIAGNOSIS — Z8619 Personal history of other infectious and parasitic diseases: Secondary | ICD-10-CM

## 2018-07-07 ENCOUNTER — Other Ambulatory Visit (INDEPENDENT_AMBULATORY_CARE_PROVIDER_SITE_OTHER): Payer: Self-pay | Admitting: Internal Medicine

## 2018-07-07 DIAGNOSIS — F419 Anxiety disorder, unspecified: Secondary | ICD-10-CM

## 2018-07-07 NOTE — Telephone Encounter (Signed)
Patient followed by Dr. Celine Mans.  Refill request for Wellbutrin is being sent to her.

## 2018-07-07 NOTE — Telephone Encounter (Signed)
Please send me refill request

## 2018-07-07 NOTE — Telephone Encounter (Signed)
Sent!

## 2018-07-08 MED ORDER — BUPROPION HCL ER (XL) 300 MG PO TB24
ORAL_TABLET | ORAL | 0 refills | Status: DC
Start: 2018-07-08 — End: 2018-08-08

## 2018-07-13 ENCOUNTER — Encounter (INDEPENDENT_AMBULATORY_CARE_PROVIDER_SITE_OTHER): Payer: Self-pay | Admitting: Internal Medicine

## 2018-07-31 ENCOUNTER — Other Ambulatory Visit (INDEPENDENT_AMBULATORY_CARE_PROVIDER_SITE_OTHER): Payer: Self-pay | Admitting: Internal Medicine

## 2018-07-31 DIAGNOSIS — Z8619 Personal history of other infectious and parasitic diseases: Secondary | ICD-10-CM

## 2018-08-02 ENCOUNTER — Other Ambulatory Visit (INDEPENDENT_AMBULATORY_CARE_PROVIDER_SITE_OTHER): Payer: Self-pay | Admitting: Internal Medicine

## 2018-08-03 ENCOUNTER — Encounter (INDEPENDENT_AMBULATORY_CARE_PROVIDER_SITE_OTHER): Payer: Self-pay

## 2018-08-03 MED ORDER — VALACYCLOVIR HCL 1 G PO TABS
1000.00 mg | ORAL_TABLET | Freq: Two times a day (BID) | ORAL | 0 refills | Status: DC
Start: 2018-08-03 — End: 2018-10-24

## 2018-08-08 ENCOUNTER — Other Ambulatory Visit (INDEPENDENT_AMBULATORY_CARE_PROVIDER_SITE_OTHER): Payer: Self-pay | Admitting: Internal Medicine

## 2018-08-08 DIAGNOSIS — F419 Anxiety disorder, unspecified: Secondary | ICD-10-CM

## 2018-09-03 ENCOUNTER — Other Ambulatory Visit (INDEPENDENT_AMBULATORY_CARE_PROVIDER_SITE_OTHER): Payer: Self-pay | Admitting: Internal Medicine

## 2018-09-06 ENCOUNTER — Other Ambulatory Visit (INDEPENDENT_AMBULATORY_CARE_PROVIDER_SITE_OTHER): Payer: Self-pay | Admitting: Internal Medicine

## 2018-09-06 DIAGNOSIS — F419 Anxiety disorder, unspecified: Secondary | ICD-10-CM

## 2018-09-13 ENCOUNTER — Encounter (INDEPENDENT_AMBULATORY_CARE_PROVIDER_SITE_OTHER): Payer: Self-pay | Admitting: Internal Medicine

## 2018-09-14 ENCOUNTER — Encounter (INDEPENDENT_AMBULATORY_CARE_PROVIDER_SITE_OTHER): Payer: Self-pay | Admitting: Internal Medicine

## 2018-09-15 ENCOUNTER — Encounter (INDEPENDENT_AMBULATORY_CARE_PROVIDER_SITE_OTHER): Payer: Self-pay

## 2018-09-15 ENCOUNTER — Ambulatory Visit (INDEPENDENT_AMBULATORY_CARE_PROVIDER_SITE_OTHER): Payer: BC Managed Care – PPO | Admitting: Internal Medicine

## 2018-09-15 VITALS — BP 120/81 | HR 101 | Temp 98.4°F | Resp 16 | Ht 64.0 in | Wt 193.0 lb

## 2018-09-15 DIAGNOSIS — M791 Myalgia, unspecified site: Secondary | ICD-10-CM

## 2018-09-15 DIAGNOSIS — G479 Sleep disorder, unspecified: Secondary | ICD-10-CM

## 2018-09-15 DIAGNOSIS — F419 Anxiety disorder, unspecified: Secondary | ICD-10-CM

## 2018-09-15 DIAGNOSIS — Z1239 Encounter for other screening for malignant neoplasm of breast: Secondary | ICD-10-CM

## 2018-09-15 DIAGNOSIS — R252 Cramp and spasm: Secondary | ICD-10-CM

## 2018-09-15 DIAGNOSIS — M79606 Pain in leg, unspecified: Secondary | ICD-10-CM

## 2018-09-15 DIAGNOSIS — L989 Disorder of the skin and subcutaneous tissue, unspecified: Secondary | ICD-10-CM

## 2018-09-15 DIAGNOSIS — E079 Disorder of thyroid, unspecified: Secondary | ICD-10-CM

## 2018-09-15 LAB — LIPID PANEL
Cholesterol / HDL Ratio: 4.5
Cholesterol: 237 mg/dL — ABNORMAL HIGH (ref 0–199)
HDL: 53 mg/dL (ref 40–9999)
LDL Calculated: 163 mg/dL — ABNORMAL HIGH (ref 0–99)
Triglycerides: 107 mg/dL (ref 34–149)
VLDL Calculated: 21 mg/dL (ref 10–40)

## 2018-09-15 LAB — COMPREHENSIVE METABOLIC PANEL
ALT: 17 U/L (ref 0–55)
AST (SGOT): 18 U/L (ref 5–34)
Albumin/Globulin Ratio: 1.2 (ref 0.9–2.2)
Albumin: 4.2 g/dL (ref 3.5–5.0)
Alkaline Phosphatase: 134 U/L — ABNORMAL HIGH (ref 37–106)
BUN: 12 mg/dL (ref 7.0–19.0)
Bilirubin, Total: 0.5 mg/dL (ref 0.2–1.2)
CO2: 26 mEq/L (ref 21–29)
Calcium: 10.4 mg/dL (ref 8.5–10.5)
Chloride: 106 mEq/L (ref 100–111)
Creatinine: 1.1 mg/dL (ref 0.4–1.5)
Globulin: 3.5 g/dL (ref 2.0–3.7)
Glucose: 86 mg/dL (ref 70–100)
Potassium: 5 mEq/L (ref 3.5–5.1)
Protein, Total: 7.7 g/dL (ref 6.0–8.3)
Sodium: 145 mEq/L (ref 136–145)

## 2018-09-15 LAB — VITAMIN D,25 OH,TOTAL: Vitamin D, 25 OH, Total: 32 ng/mL (ref 30–100)

## 2018-09-15 LAB — IRON: Iron: 79 ug/dL (ref 40–145)

## 2018-09-15 LAB — MAGNESIUM: Magnesium: 2.6 mg/dL (ref 1.6–2.6)

## 2018-09-15 LAB — T3, FREE: T3, Free: 3.01 pg/mL (ref 1.71–3.71)

## 2018-09-15 LAB — HEMOLYSIS INDEX: Hemolysis Index: 10 (ref 0–18)

## 2018-09-15 LAB — GFR: EGFR: 51.5

## 2018-09-15 LAB — TSH: TSH: 1.14 u[IU]/mL (ref 0.35–4.94)

## 2018-09-15 LAB — T4: T4: 9.9 ug/dL (ref 4.9–11.7)

## 2018-09-15 MED ORDER — ALPRAZOLAM 0.5 MG PO TABS
0.50 mg | ORAL_TABLET | Freq: Three times a day (TID) | ORAL | 0 refills | Status: DC | PRN
Start: 2018-09-15 — End: 2019-02-10

## 2018-09-15 NOTE — Progress Notes (Signed)
Subjective:      Patient ID: Gwendolyn [name] is a 56 y.o. female.    Chief Complaint:  Chief Complaint   Patient presents with   . Skin Issues     Generalized. Mostly everywhere.   . Hyperlipidemia       HPI:  She is having skin disorder.  She states that she feels sensitive to touch and states that she has had a sunburn and on her lower back.  She states the sensation moves around.  She states it is worse at night and more pronounced .  She states her legs spasm at night and sthe states that she has pain all over her legs.  SHe states it is every night.  She has not been taking vit d.  She also states that feels her skin is crawling at night.  She has thyroid disorder . She also has anxiety     Hyperlipidemia   The patient is being seen for a routine follow-up of hyperlipidemia. There is no interval history of palpitations, myalgias, chest pain, chest pressure/discomfort, dyspnea, exertional chest pressure/discomfort, irregular heart beat and paroxysmal nocturnal dyspnea.       Problem List:  Patient Active Problem List   Diagnosis   . Right ovarian cyst   . Pain, female pelvic   . Obesity (BMI 30-39.9)       Current Medications:  Current Outpatient Medications   Medication Sig Dispense Refill   . ALPRAZolam (XANAX) 0.5 MG tablet Take 1 tablet (0.5 mg total) by mouth 3 (three) times daily as needed for Sleep or Anxiety. 30 tablet 0   . ARMOUR THYROID 30 MG tablet TAKE 1 TABLET BY MOUTH DAILY 30 tablet 0   . buPROPion XL (WELLBUTRIN XL) 300 MG 24 hr tablet TAKE 1 TABLET(300 MG) BY MOUTH DAILY 30 tablet 0   . Eszopiclone 3 MG tablet Take 1 tablet (3 mg total) by mouth nightly.Take immediately before bedtime 30 tablet 0   . tiZANidine (ZANAFLEX) 4 MG tablet Take 4 mg by mouth as needed.     1   . tiZANidine (ZANAFLEX) 4 MG tablet tizanidine hcl 4 mg tabs     . valacyclovir (VALTREX) 1000 MG tablet Take 1 tablet (1,000 mg total) by mouth 2 (two) times daily 20 tablet 0   . DUEXIS 800-26.6 MG Tab        No current  facility-administered medications for this visit.        Allergies:  Allergies   Allergen Reactions   . Beef-Derived Products Anaphylaxis     And Pork products   . Erythromycin Hives   . Other Swelling and Respiratory Distress     Bee sting    . Penicillins Hives       Past Medical History:  Past Medical History:   Diagnosis Date   . Anxiety    . Arrhythmia     Occasional SVT. (Happens rarely)    . Arthritis    . Calculus of kidney    . Depression    . Headache    . Health care maintenance very nice patient     Dr Fayrene Helper husband is my patient  Colo done 2015 due 2020 Pap 2015  Mammogram 2015   . Herpes    . Hypothyroidism    . Low back pain    . Ovarian cyst    . Post-operative nausea and vomiting     APFEL 3, after last laparoscopy woke up w/ SEVERE  pain   . Procreative management    . SVT (supraventricular tachycardia)        Past Surgical History:  Past Surgical History:   Procedure Laterality Date   . COLONOSCOPY     . FOOT SURGERY     . LAPAROSCOPIC, APPENDECTOMY N/A 07/13/2015    Procedure: LAPAROSCOPIC, APPENDECTOMY;  Surgeon: Delena Serve, MD;  Location: [facility];  Service: General;  Laterality: N/A;   . LAPAROSCOPIC, ENTEROLYSIS N/A 07/13/2015    Procedure: LAPAROSCOPIC, LYSIS OF ADHESIONS;  Surgeon: Delena Serve, MD;  Location: [facility];  Service: General;  Laterality: N/A;   . LAPAROSCOPIC, LYSIS, ADHESIONS N/A 11/22/2014    Procedure: LAPAROSCOPIC, LYSIS, ADHESIONS;  Surgeon: Waldon Merl, MD;  Location: [facility];  Service: Gynecology;  Laterality: N/A;   . LAPAROSCOPIC, OPERATIVE N/A 11/22/2014    Procedure: LAPAROSCOPIC OVARIAN CYSTOTOMY;  Surgeon: Waldon Merl, MD;  Location: [facility];  Service: Gynecology;  Laterality: N/A;   . LAPAROSCOPIC, OPERATIVE N/A 07/13/2015    Procedure: LAPAROSCOPIC  SALPINGO - OOPHORECTOMY;  Surgeon: Ronalee Red, MD;  Location: [facility];  Service: Gynecology;  Laterality: N/A;   . LAPAROSCOPY, DIAGNOSTIC N/A 07/13/2015     Procedure: LAPAROSCOPY, DIAGNOSTIC;  Surgeon: Delena Serve, MD;  Location: [facility];  Service: General;  Laterality: N/A;   . REDUCTION MAMMAPLASTY     . REPAIR, LOWER EXTREMITY TENDON Right 05/13/2017    Procedure: RIGHT PERONEAL BREVIS REPAIR AND TENODESIS; RIGHT FIBULA GROOVE DEEPENING.;  Surgeon: Kathi Der, MD;  Location: [facility];  Service: Orthopedics;  Laterality: Right;   . TONSILLECTOMY         Family History:  Family History   Problem Relation Age of Onset   . Hyperlipidemia Mother    . Hyperlipidemia Father    . Skin cancer Sister    . Breast cancer Sister 58   . Diabetes Sister    . Hyperlipidemia Sister    . Heart attack Brother    . Hyperlipidemia Brother    . Breast cancer Maternal Aunt 54   . Cervical cancer Maternal Aunt    . Lung cancer Maternal Uncle    . Brain cancer Paternal Uncle    . Diabetes Maternal Grandmother    . Diabetes Paternal Grandfather    . Cancer Cousin    . Colon cancer Neg Hx    . Ovarian cancer Neg Hx        Social History:  Social History     Socioeconomic History   . Marital status: Married     Spouse name: Not on file   . Number of children: Not on file   . Years of education: Not on file   . Highest education level: Not on file   Occupational History   . Not on file   Social Needs   . Financial resource strain: Not on file   . Food insecurity:     Worry: Not on file     Inability: Not on file   . Transportation needs:     Medical: Not on file     Non-medical: Not on file   Tobacco Use   . Smoking status: Never Smoker   . Smokeless tobacco: Never Used   Substance and Sexual Activity   . Alcohol use: Yes     Alcohol/week: 1.0 standard drinks     Types: 1 Standard drinks or equivalent per week  Comment: rare   . Drug use: No   . Sexual activity: Yes     Partners: Male     Birth control/protection: None   Lifestyle   . Physical activity:     Days per week: Not on file     Minutes per session: Not on file   . Stress: Not on file   Relationships   .  Social connections:     Talks on phone: Not on file     Gets together: Not on file     Attends religious service: Not on file     Active member of club or organization: Not on file     Attends meetings of clubs or organizations: Not on file     Relationship status: Not on file   . Intimate partner violence:     Fear of current or ex partner: Not on file     Emotionally abused: Not on file     Physically abused: Not on file     Forced sexual activity: Not on file   Other Topics Concern   . Not on file   Social History Narrative   . Not on file       The following sections were reviewed this encounter by the provider:        ROS:  Review of Systems   Constitutional: Negative.    Eyes: Negative.    Respiratory: Negative.    Cardiovascular: Negative.    Gastrointestinal: Negative.    Genitourinary: Negative.    Musculoskeletal: Negative.        Vitals:  BP 120/81 (BP Site: Right arm, Patient Position: Sitting, Cuff Size: Medium)   Pulse (!) 101   Temp 98.4 F (36.9 C) (Oral)   Resp 16   Ht 1.626 m (5\' 4" )   Wt 87.5 kg (193 lb)   BMI 33.13 kg/m      Objective:     Physical Exam:  Physical Exam  Vitals signs and nursing note reviewed.   Constitutional:       Appearance: Normal appearance.   Neck:      Musculoskeletal: Normal range of motion and neck supple.   Cardiovascular:      Rate and Rhythm: Normal rate and regular rhythm.   Pulmonary:      Effort: Pulmonary effort is normal.      Breath sounds: Normal breath sounds.   Abdominal:      General: Abdomen is flat.      Palpations: Abdomen is soft.   Neurological:      General: No focal deficit present.      Mental Status: She is alert and oriented to person, place, and time.          Assessment:     1. Skin disorder  - Ambulatory referral to Dermatology      Plan:     2. Muscle aches    Check labs    3. Constipation  Increase water and fiber    4. Headaches due to sinus disorder  rx zyrtec    5. Hypothyorid  Check labs    1. Skin disorder  Ambulatory referral to  Dermatology    Comprehensive metabolic panel    Lipid panel    TSH    T3, free    T4, Total    Vitamin D,25 OH, Total    Iron    Magnesium   2. Thyroid disorder  Comprehensive metabolic panel    Lipid panel  TSH    T3, free    T4, Total    Vitamin D,25 OH, Total    Iron    Magnesium   3. Muscle ache  Comprehensive metabolic panel    Lipid panel    TSH    T3, free    T4, Total    Vitamin D,25 OH, Total    Iron    Magnesium   4. Pain of lower extremity, unspecified laterality  Comprehensive metabolic panel    Lipid panel    TSH    T3, free    T4, Total    Vitamin D,25 OH, Total    Iron    Magnesium   5. Leg cramp  Comprehensive metabolic panel    Lipid panel    TSH    T3, free    T4, Total    Vitamin D,25 OH, Total    Iron    Magnesium     5. Sleep disorder   go to Neuro if not improved    6. Anxiety   rx xnaax prn only 30 every 6 months   Aima Mcwhirt Angelene Giovanni, MD

## 2018-09-15 NOTE — Addendum Note (Signed)
Addended by: Shea Evans on: 09/15/2018 11:22 AM     Modules accepted: Orders

## 2018-09-17 NOTE — Progress Notes (Signed)
Left vm for patient w/ results information. Also advised to call office w/ any questions.

## 2018-10-01 ENCOUNTER — Other Ambulatory Visit (INDEPENDENT_AMBULATORY_CARE_PROVIDER_SITE_OTHER): Payer: Self-pay | Admitting: Internal Medicine

## 2018-10-01 ENCOUNTER — Encounter (INDEPENDENT_AMBULATORY_CARE_PROVIDER_SITE_OTHER): Payer: Self-pay | Admitting: Internal Medicine

## 2018-10-01 DIAGNOSIS — F419 Anxiety disorder, unspecified: Secondary | ICD-10-CM

## 2018-10-06 ENCOUNTER — Encounter (INDEPENDENT_AMBULATORY_CARE_PROVIDER_SITE_OTHER): Payer: Self-pay | Admitting: Internal Medicine

## 2018-10-07 ENCOUNTER — Encounter (INDEPENDENT_AMBULATORY_CARE_PROVIDER_SITE_OTHER): Payer: Self-pay

## 2018-10-24 ENCOUNTER — Other Ambulatory Visit (INDEPENDENT_AMBULATORY_CARE_PROVIDER_SITE_OTHER): Payer: Self-pay | Admitting: Internal Medicine

## 2018-10-24 DIAGNOSIS — Z8619 Personal history of other infectious and parasitic diseases: Secondary | ICD-10-CM

## 2018-10-31 ENCOUNTER — Other Ambulatory Visit (INDEPENDENT_AMBULATORY_CARE_PROVIDER_SITE_OTHER): Payer: Self-pay | Admitting: Internal Medicine

## 2018-11-01 ENCOUNTER — Other Ambulatory Visit (INDEPENDENT_AMBULATORY_CARE_PROVIDER_SITE_OTHER): Payer: Self-pay | Admitting: Internal Medicine

## 2018-11-01 DIAGNOSIS — F419 Anxiety disorder, unspecified: Secondary | ICD-10-CM

## 2018-11-02 ENCOUNTER — Encounter (INDEPENDENT_AMBULATORY_CARE_PROVIDER_SITE_OTHER): Payer: Self-pay | Admitting: Internal Medicine

## 2018-11-21 ENCOUNTER — Other Ambulatory Visit (INDEPENDENT_AMBULATORY_CARE_PROVIDER_SITE_OTHER): Payer: Self-pay | Admitting: Internal Medicine

## 2018-11-21 DIAGNOSIS — Z8619 Personal history of other infectious and parasitic diseases: Secondary | ICD-10-CM

## 2018-11-29 ENCOUNTER — Other Ambulatory Visit (INDEPENDENT_AMBULATORY_CARE_PROVIDER_SITE_OTHER): Payer: Self-pay | Admitting: Internal Medicine

## 2018-11-29 DIAGNOSIS — F419 Anxiety disorder, unspecified: Secondary | ICD-10-CM

## 2018-12-02 ENCOUNTER — Encounter (HOSPITAL_BASED_OUTPATIENT_CLINIC_OR_DEPARTMENT_OTHER): Payer: Self-pay | Admitting: Obstetrics & Gynecology

## 2018-12-02 ENCOUNTER — Ambulatory Visit (FREE_STANDING_LABORATORY_FACILITY): Payer: BC Managed Care – PPO | Admitting: Obstetrics & Gynecology

## 2018-12-02 ENCOUNTER — Ambulatory Visit: Payer: Self-pay

## 2018-12-02 VITALS — BP 118/81 | HR 76 | Ht 63.5 in | Wt 188.6 lb

## 2018-12-02 DIAGNOSIS — Z1239 Encounter for other screening for malignant neoplasm of breast: Secondary | ICD-10-CM

## 2018-12-02 DIAGNOSIS — Z124 Encounter for screening for malignant neoplasm of cervix: Secondary | ICD-10-CM

## 2018-12-02 DIAGNOSIS — Z01419 Encounter for gynecological examination (general) (routine) without abnormal findings: Secondary | ICD-10-CM

## 2018-12-02 DIAGNOSIS — Z803 Family history of malignant neoplasm of breast: Secondary | ICD-10-CM

## 2018-12-02 DIAGNOSIS — Z1151 Encounter for screening for human papillomavirus (HPV): Secondary | ICD-10-CM

## 2018-12-02 NOTE — Patient Instructions (Signed)
You had a normal well woman exam including a normal breast exam today.   Please continue to do your monthly self breast exam.  Continue to eat a diet with many fruits, vegetables and lean proteins.   It is best to aim for 30-60 minutes of exercise daily.   Please consume a diet rich in calcium and vitamin D. The recommended intake for women 50-70 for calcium is at least 1200 mg daily and for vitamin D is 600 international units daily.   After age 56 the recommended intake vitamin D is 800 international units daily.   Please use condoms with every intercourse to prevent sexually transmitted infections.   You should follow up in 1 year for a well woman exam.   After age 30 pap testing is recommended every 5 years.   Mammogram screening is recommended starting at age 40 every year to two years depending on family and personal history.  Colon cancer screening is recommended starting at age 50 or sooner depending on personal or family history.   Bone density screening should start at age 65.     Patient Copy

## 2018-12-03 ENCOUNTER — Other Ambulatory Visit (HOSPITAL_BASED_OUTPATIENT_CLINIC_OR_DEPARTMENT_OTHER): Payer: Self-pay | Admitting: Obstetrics & Gynecology

## 2018-12-03 ENCOUNTER — Other Ambulatory Visit: Payer: Self-pay

## 2018-12-03 DIAGNOSIS — Z1239 Encounter for other screening for malignant neoplasm of breast: Secondary | ICD-10-CM

## 2018-12-04 LAB — THIN PREP PAP W/IMAGE ANALYSIS AND HPV
HPV Genotype, 16: NEGATIVE
HPV Genotype, 18: NEGATIVE
HPV High Risk, Other: NEGATIVE

## 2018-12-06 NOTE — Progress Notes (Signed)
Chief Complaint: Annual GYN examination    History of Present Illness: Gwendolyn [name] is a 58 y.o. female, G0P0000, No LMP recorded. (Menstrual status: Menopausal). Here today for annual gyn visit. She also reports a strong family history of breast cancer (mother, twin sister, maternal aunt, maternal grandmother).  All were postmenopausal at diagnosis. No one has had genetic testing that she is aware of.     Gynecolgoic History:   Menopausal,   no h/o STDs  no h/o abnormal pap smears.   Last pap 2014  Last mammogram: 2014  Pt is sexually active with men  Contraception: none       Obstetric History:   OB History   Gravida Para Term Preterm AB Living   0 0 0 0 0 0   SAB TAB Ectopic Multiple Live Births   0 0 0 0         Past Medical History:  Past Medical History:   Diagnosis Date   . Anxiety    . Arrhythmia     Occasional SVT. (Happens rarely)    . Arthritis    . Calculus of kidney    . Depression    . Headache    . Health care maintenance very nice patient     Dr Fayrene Helper husband is my patient  Colo done 2015 due 2020 Pap 2015  Mammogram 2015   . Herpes    . Hypothyroidism    . Low back pain    . Ovarian cyst    . Post-operative nausea and vomiting     APFEL 3, after last laparoscopy woke up w/ SEVERE pain   . Procreative management    . SVT (supraventricular tachycardia)            Past Surgical History:  Past Surgical History:   Procedure Laterality Date   . COLONOSCOPY     . FOOT SURGERY     . LAPAROSCOPIC, APPENDECTOMY N/A 07/13/2015    Procedure: LAPAROSCOPIC, APPENDECTOMY;  Surgeon: Delena Serve, MD;  Location: [facility];  Service: General;  Laterality: N/A;   . LAPAROSCOPIC, ENTEROLYSIS N/A 07/13/2015    Procedure: LAPAROSCOPIC, LYSIS OF ADHESIONS;  Surgeon: Delena Serve, MD;  Location: [facility];  Service: General;  Laterality: N/A;   . LAPAROSCOPIC, LYSIS, ADHESIONS N/A 11/22/2014    Procedure: LAPAROSCOPIC, LYSIS, ADHESIONS;  Surgeon: Waldon Merl, MD;  Location: [facility];   Service: Gynecology;  Laterality: N/A;   . LAPAROSCOPIC, OPERATIVE N/A 11/22/2014    Procedure: LAPAROSCOPIC OVARIAN CYSTOTOMY;  Surgeon: Waldon Merl, MD;  [facility];  Service: Gynecology;  Laterality: N/A;   . LAPAROSCOPIC, OPERATIVE N/A 07/13/2015    Procedure: LAPAROSCOPIC  SALPINGO - OOPHORECTOMY;  Surgeon: Ronalee Red, MD;  Location: [facility];  Service: Gynecology;  Laterality: N/A;   . LAPAROSCOPY, DIAGNOSTIC N/A 07/13/2015    Procedure: LAPAROSCOPY, DIAGNOSTIC;  Surgeon: Delena Serve, MD;  [facility]: [facility];  Service: General;  Laterality: N/A;   . REDUCTION MAMMAPLASTY     . REPAIR, LOWER EXTREMITY TENDON Right 05/13/2017    Procedure: RIGHT PERONEAL BREVIS REPAIR AND TENODESIS; RIGHT FIBULA GROOVE DEEPENING.;  Surgeon: Kathi Der, MD;  Location: [facility];  Service: Orthopedics;  Laterality: Right;   . TONSILLECTOMY         Social History:  Social History     Socioeconomic History   . Marital status: Married     Spouse name:  Not on file   . Number of children: Not on file   . Years of education: Not on file   . Highest education level: Not on file   Occupational History   . Not on file   Social Needs   . Financial resource strain: Not on file   . Food insecurity:     Worry: Not on file     Inability: Not on file   . Transportation needs:     Medical: Not on file     Non-medical: Not on file   Tobacco Use   . Smoking status: Never Smoker   . Smokeless tobacco: Never Used   Substance and Sexual Activity   . Alcohol use: Yes     Alcohol/week: 1.0 standard drinks     Types: 1 Standard drinks or equivalent per week     Comment: rare   . Drug use: No   . Sexual activity: Yes     Partners: Male     Birth control/protection: None   Lifestyle   . Physical activity:     Days per week: Not on file     Minutes per session: Not on file   . Stress: Not on file   Relationships   . Social connections:     Talks on phone: Not on file     Gets together: Not on file     Attends  religious service: Not on file     Active member of club or organization: Not on file     Attends meetings of clubs or organizations: Not on file     Relationship status: Not on file   . Intimate partner violence:     Fear of current or ex partner: Not on file     Emotionally abused: Not on file     Physically abused: Not on file     Forced sexual activity: Not on file   Other Topics Concern   . Not on file   Social History Narrative   . Not on file       Family History:  Family History   Problem Relation Age of Onset   . Hyperlipidemia Mother    . Hyperlipidemia Father    . Skin cancer Sister    . Breast cancer Sister 63   . Diabetes Sister    . Hyperlipidemia Sister    . Heart attack Brother    . Hyperlipidemia Brother    . Breast cancer Maternal Aunt 54   . Cervical cancer Maternal Aunt    . Lung cancer Maternal Uncle    . Brain cancer Paternal Uncle    . Diabetes Maternal Grandmother    . Diabetes Paternal Grandfather    . Cancer Cousin    . Colon cancer Neg Hx    . Ovarian cancer Neg Hx        Allergies:   Allergies   Allergen Reactions   . Beef-Derived Products Anaphylaxis     And Pork products   . Erythromycin Hives   . Other Swelling and Respiratory Distress     Bee sting    . Penicillins Hives       Medications:   Current Outpatient Medications   Medication Sig Dispense Refill   . ARMOUR THYROID 30 MG tablet TAKE 1 TABLET BY MOUTH DAILY 30 tablet 0   . buPROPion XL (WELLBUTRIN XL) 150 MG 24 hr tablet TAKE 1 TABLET BY MOUTH DAILY 30 tablet 0   .  tiZANidine (ZANAFLEX) 4 MG tablet Take 4 mg by mouth as needed.     1   . ALPRAZolam (XANAX) 0.5 MG tablet Take 1 tablet (0.5 mg total) by mouth 3 (three) times daily as needed for Sleep or Anxiety 30 tablet 0   . buPROPion XL (WELLBUTRIN XL) 300 MG 24 hr tablet TAKE 1 TABLET(300 MG) BY MOUTH DAILY 30 tablet 0   . Eszopiclone 3 MG tablet Take 1 tablet (3 mg total) by mouth nightly.Take immediately before bedtime 30 tablet 0   . gabapentin (NEURONTIN) 100 MG capsule  TK 3 CS PO QHS  5   . valacyclovir (VALTREX) 1000 MG tablet TAKE 1 TABLET(1000 MG) BY MOUTH TWICE DAILY 20 tablet 0     No current facility-administered medications for this visit.        Review of Systems:  General ROS: negative for fatigue, fever/chills, weight loss  Breast ROS: negative for breast lumps, nipple discharge  Gastrointestinal ROS: no abdominal pain, change in bowel habits  Genitourinary ROS: no dysuria, trouble voiding, or hematuria  Skin: denies rash or lesion  Psych: denies depression  All other systems reviewed and are negative     Physical Exam:   BP 118/81   Pulse 76   Ht 5' 3.5" (1.613 m)   Wt 188 lb 9.6 oz (85.5 kg)   BMI 32.88 kg/m   GENERAL - alert, well appearing, and in no distress, obese  BREAST: breasts appear normal, symmetrical, no suspicious masses, no skin or nipple changes or axillary nodes, no nipple discharge  ABDOMEN - soft, nontender, nondistended, no masses  EXTREMITIES: no signs of clubbing or edema   URETHRA: No lesions or discharge  BLADDER: Nontender, no prolapse  VULVA: normal appearing vulva with no masses, tenderness or lesions, normal clitoris  VAGINA: normal appearing vagina with normal color and discharge, no lesions  CERVIX: normal appearing cervix without discharge or lesions. Pap/HPV collected.  UTERUS: uterus is normal size, shape, consistency and nontender, anteverted  ADNEXA:  nontender and no masses.  RECTAL: normal external, no hemorrhoids    Assessment:  1. Women's annual routine gynecological examination    2. Screening breast examination    3. Screening for cervical cancer    4. Screening for human papillomavirus (HPV)    5. Family history of breast cancer        Plan:  - Well woman exam completed  - Discussed cervical cancer screening and recommended intervals.  - Discussed screening mammography, initiation, intervals, risks and benefits.  - Self breast exam reviewed, self-breast awareness encouraged.    - Contraception counseling completed, LARC  recommended  - STI testing offered. Safer sex reviewed  - Referred for genetic counseling given strong family history of breast cancer.    - All questions answered and AVS reviewed with the patient.    - Follow up in 1 year for well woman exam or PRN for issues    Orders Placed This Encounter   Procedures   . Ambulatory referral to [facility] MD

## 2018-12-07 LAB — LAB USE ONLY - HISTORICAL GYN CYTOLOGY/PAP SMEAR

## 2018-12-21 ENCOUNTER — Encounter (INDEPENDENT_AMBULATORY_CARE_PROVIDER_SITE_OTHER): Payer: Self-pay | Admitting: Internal Medicine

## 2019-01-04 ENCOUNTER — Ambulatory Visit: Payer: BC Managed Care – PPO

## 2019-01-04 ENCOUNTER — Ambulatory Visit: Payer: BC Managed Care – PPO | Attending: Internal Medicine

## 2019-01-04 DIAGNOSIS — Z1239 Encounter for other screening for malignant neoplasm of breast: Secondary | ICD-10-CM | POA: Insufficient documentation

## 2019-01-05 ENCOUNTER — Other Ambulatory Visit (INDEPENDENT_AMBULATORY_CARE_PROVIDER_SITE_OTHER): Payer: Self-pay | Admitting: Internal Medicine

## 2019-01-05 DIAGNOSIS — Z8619 Personal history of other infectious and parasitic diseases: Secondary | ICD-10-CM

## 2019-01-07 ENCOUNTER — Other Ambulatory Visit (INDEPENDENT_AMBULATORY_CARE_PROVIDER_SITE_OTHER): Payer: Self-pay | Admitting: Internal Medicine

## 2019-01-07 DIAGNOSIS — Z8619 Personal history of other infectious and parasitic diseases: Secondary | ICD-10-CM

## 2019-01-11 ENCOUNTER — Encounter (HOSPITAL_BASED_OUTPATIENT_CLINIC_OR_DEPARTMENT_OTHER): Payer: Self-pay | Admitting: Obstetrics & Gynecology

## 2019-01-12 ENCOUNTER — Ambulatory Visit
Admission: RE | Admit: 2019-01-12 | Discharge: 2019-01-12 | Disposition: A | Discharge status: Home / Self Care | Payer: Self-pay | Source: Ambulatory Visit | Attending: Internal Medicine | Admitting: Internal Medicine

## 2019-01-12 ENCOUNTER — Other Ambulatory Visit (INDEPENDENT_AMBULATORY_CARE_PROVIDER_SITE_OTHER): Payer: Self-pay | Admitting: Internal Medicine

## 2019-01-12 DIAGNOSIS — Z1239 Encounter for other screening for malignant neoplasm of breast: Secondary | ICD-10-CM

## 2019-01-12 DIAGNOSIS — R928 Other abnormal and inconclusive findings on diagnostic imaging of breast: Secondary | ICD-10-CM

## 2019-01-13 ENCOUNTER — Other Ambulatory Visit (INDEPENDENT_AMBULATORY_CARE_PROVIDER_SITE_OTHER): Payer: Self-pay | Admitting: Internal Medicine

## 2019-01-13 ENCOUNTER — Ambulatory Visit: Payer: BC Managed Care – PPO | Attending: Internal Medicine

## 2019-01-13 DIAGNOSIS — R928 Other abnormal and inconclusive findings on diagnostic imaging of breast: Secondary | ICD-10-CM

## 2019-01-15 ENCOUNTER — Encounter (INDEPENDENT_AMBULATORY_CARE_PROVIDER_SITE_OTHER): Payer: Self-pay | Admitting: Internal Medicine

## 2019-01-15 ENCOUNTER — Ambulatory Visit: Payer: BC Managed Care – PPO

## 2019-01-26 ENCOUNTER — Encounter (INDEPENDENT_AMBULATORY_CARE_PROVIDER_SITE_OTHER): Payer: Self-pay | Admitting: Internal Medicine

## 2019-01-30 ENCOUNTER — Encounter (INDEPENDENT_AMBULATORY_CARE_PROVIDER_SITE_OTHER): Payer: Self-pay | Admitting: Internal Medicine

## 2019-02-01 ENCOUNTER — Other Ambulatory Visit (INDEPENDENT_AMBULATORY_CARE_PROVIDER_SITE_OTHER): Payer: Self-pay | Admitting: Internal Medicine

## 2019-02-01 DIAGNOSIS — K219 Gastro-esophageal reflux disease without esophagitis: Secondary | ICD-10-CM

## 2019-02-02 ENCOUNTER — Encounter (INDEPENDENT_AMBULATORY_CARE_PROVIDER_SITE_OTHER): Payer: Self-pay | Admitting: Internal Medicine

## 2019-02-09 ENCOUNTER — Encounter (INDEPENDENT_AMBULATORY_CARE_PROVIDER_SITE_OTHER): Payer: Self-pay | Admitting: Internal Medicine

## 2019-02-09 ENCOUNTER — Other Ambulatory Visit (INDEPENDENT_AMBULATORY_CARE_PROVIDER_SITE_OTHER): Payer: Self-pay | Admitting: Internal Medicine

## 2019-02-09 DIAGNOSIS — Z8619 Personal history of other infectious and parasitic diseases: Secondary | ICD-10-CM

## 2019-02-09 MED ORDER — VALACYCLOVIR HCL 1 G PO TABS
ORAL_TABLET | ORAL | 0 refills | Status: DC
Start: 2019-02-09 — End: 2019-03-24

## 2019-02-09 MED ORDER — THYROID 30 MG PO TABS
30.0000 mg | ORAL_TABLET | Freq: Every day | ORAL | 0 refills | Status: DC
Start: 2019-02-09 — End: 2019-03-11

## 2019-02-10 ENCOUNTER — Ambulatory Visit (INDEPENDENT_AMBULATORY_CARE_PROVIDER_SITE_OTHER): Payer: BC Managed Care – PPO | Admitting: Internal Medicine

## 2019-02-10 ENCOUNTER — Other Ambulatory Visit (INDEPENDENT_AMBULATORY_CARE_PROVIDER_SITE_OTHER): Payer: Self-pay | Admitting: Internal Medicine

## 2019-02-10 ENCOUNTER — Encounter (INDEPENDENT_AMBULATORY_CARE_PROVIDER_SITE_OTHER): Payer: Self-pay | Admitting: Internal Medicine

## 2019-02-10 DIAGNOSIS — F419 Anxiety disorder, unspecified: Secondary | ICD-10-CM

## 2019-02-10 MED ORDER — ALPRAZOLAM 0.5 MG PO TABS
0.5000 mg | ORAL_TABLET | Freq: Three times a day (TID) | ORAL | 0 refills | Status: DC | PRN
Start: 2019-02-10 — End: 2019-06-11

## 2019-02-10 NOTE — Progress Notes (Signed)
Subjective:      Patient ID: Gwendolyn [name] is a 58 y.o. female.    Chief Complaint:  Chief Complaint   Patient presents with   . Anxiety       HPI:  Anxiety   Presents for follow-up visit. Symptoms include excessive worry and nervous/anxious behavior. Patient reports no chest pain, confusion, decreased concentration, dizziness, feeling of choking, impotence, insomnia, muscle tension, nausea, palpitations, panic or shortness of breath.           Problem List:  Patient Active Problem List   Diagnosis   . Right ovarian cyst   . Pain, female pelvic   . Obesity (BMI 30-39.9)       Current Medications:  Current Outpatient Medications   Medication Sig Dispense Refill   . ALPRAZolam (XANAX) 0.5 MG tablet Take 1 tablet (0.5 mg total) by mouth 3 (three) times daily as needed for Sleep or Anxiety 30 tablet 0   . buPROPion XL (WELLBUTRIN XL) 300 MG 24 hr tablet TAKE 1 TABLET(300 MG) BY MOUTH DAILY 30 tablet 0   . Eszopiclone 3 MG tablet Take 1 tablet (3 mg total) by mouth nightly.Take immediately before bedtime 30 tablet 0   . gabapentin (NEURONTIN) 100 MG capsule TK 3 CS PO QHS  5   . thyroid (ARMOUR THYROID) 30 MG tablet Take 1 tablet (30 mg total) by mouth daily 30 tablet 0   . tiZANidine (ZANAFLEX) 4 MG tablet Take 4 mg by mouth as needed.     1   . valacyclovir (VALTREX) 1000 MG tablet TAKE 1 TABLET(1000 MG) BY MOUTH TWICE DAILY 60 tablet 0     No current facility-administered medications for this visit.        Allergies:  Allergies   Allergen Reactions   . Beef-Derived Products Anaphylaxis     And Pork products   . Erythromycin Hives   . Other Swelling and Respiratory Distress     Bee sting    . Penicillins Hives       Past Medical History:  Past Medical History:   Diagnosis Date   . Anxiety    . Arrhythmia     Occasional SVT. (Happens rarely)    . Arthritis    . Calculus of kidney    . Depression    . Headache    . Health care maintenance very nice patient     Dr Fayrene Helper husband is my patient  Colo done 2015 due 2020  Pap 2015  Mammogram 2015   . Herpes    . Hypothyroidism    . Low back pain    . Ovarian cyst    . Post-operative nausea and vomiting     APFEL 3, after last laparoscopy woke up w/ SEVERE pain   . Procreative management    . SVT (supraventricular tachycardia)        Past Surgical History:  Past Surgical History:   Procedure Laterality Date   . COLONOSCOPY     . FOOT SURGERY     . LAPAROSCOPIC, APPENDECTOMY N/A 07/13/2015    Procedure: LAPAROSCOPIC, APPENDECTOMY;  Surgeon: Delena Serve, MD;  Location: [facility];  Service: General;  Laterality: N/A;   . LAPAROSCOPIC, ENTEROLYSIS N/A 07/13/2015    Procedure: LAPAROSCOPIC, LYSIS OF ADHESIONS;  Surgeon: Delena Serve, MD;  Location: [facility];  Service: General;  Laterality: N/A;   . LAPAROSCOPIC, LYSIS, ADHESIONS N/A 11/22/2014    Procedure: LAPAROSCOPIC, LYSIS, ADHESIONS;  Surgeon:  Waldon Merl, MD;  Location: [facility];  Service: Gynecology;  Laterality: N/A;   . LAPAROSCOPIC, OPERATIVE N/A 11/22/2014    Procedure: LAPAROSCOPIC OVARIAN CYSTOTOMY;  Surgeon: Waldon Merl, MD;  Location: [facility];  Service: Gynecology;  Laterality: N/A;   . LAPAROSCOPIC, OPERATIVE N/A 07/13/2015    Procedure: LAPAROSCOPIC  SALPINGO - OOPHORECTOMY;  Surgeon: Ronalee Red, MD;  Location: [facility];  Service: Gynecology;  Laterality: N/A;   . LAPAROSCOPY, DIAGNOSTIC N/A 07/13/2015    Procedure: LAPAROSCOPY, DIAGNOSTIC;  Surgeon: Delena Serve, MD;  Location[facility];  Service: General;  Laterality: N/A;   . REDUCTION MAMMAPLASTY Bilateral 2005   . REPAIR, LOWER EXTREMITY TENDON Right 05/13/2017    Procedure: RIGHT PERONEAL BREVIS REPAIR AND TENODESIS; RIGHT FIBULA GROOVE DEEPENING.;  Surgeon: Kathi Der, MD;  Location[facility];  Service: Orthopedics;  Laterality: Right;   . TONSILLECTOMY         Family History:  Family History   Problem Relation Age of Onset   . Hyperlipidemia Mother    . Breast cancer Mother    . Hyperlipidemia  Father    . Skin cancer Sister    . Breast cancer Sister 53   . Diabetes Sister    . Hyperlipidemia Sister    . Heart attack Brother    . Hyperlipidemia Brother    . Breast cancer Maternal Aunt 54   . Cervical cancer Maternal Aunt    . Lung cancer Maternal Uncle    . Brain cancer Paternal Uncle    . Diabetes Maternal Grandmother    . Diabetes Paternal Grandfather    . Cancer Cousin    . Colon cancer Neg Hx    . Ovarian cancer Neg Hx        Social History:  Social History     Socioeconomic History   . Marital status: Married     Spouse name: Not on file   . Number of children: Not on file   . Years of education: Not on file   . Highest education level: Not on file   Occupational History   . Not on file   Social Needs   . Financial resource strain: Not on file   . Food insecurity:     Worry: Not on file     Inability: Not on file   . Transportation needs:     Medical: Not on file     Non-medical: Not on file   Tobacco Use   . Smoking status: Never Smoker   . Smokeless tobacco: Never Used   Substance and Sexual Activity   . Alcohol use: Yes     Alcohol/week: 1.0 standard drinks     Types: 1 Standard drinks or equivalent per week     Comment: rare   . Drug use: No   . Sexual activity: Yes     Partners: Male     Birth control/protection: None   Lifestyle   . Physical activity:     Days per week: Not on file     Minutes per session: Not on file   . Stress: Not on file   Relationships   . Social connections:     Talks on phone: Not on file     Gets together: Not on file     Attends religious service: Not on file     Active member of club or organization: Not on file  Attends meetings of clubs or organizations: Not on file     Relationship status: Not on file   . Intimate partner violence:     Fear of current or ex partner: Not on file     Emotionally abused: Not on file     Physically abused: Not on file     Forced sexual activity: Not on file   Other Topics Concern   . Not on file   Social History Narrative   . Not on  file       The following sections were reviewed this encounter by the provider:        ROS:  Review of Systems   Respiratory: Negative for shortness of breath.    Cardiovascular: Negative for chest pain and palpitations.   Gastrointestinal: Negative for nausea.   Genitourinary: Negative for impotence.   Neurological: Negative for dizziness.   Psychiatric/Behavioral: Negative for confusion and decreased concentration. The patient is nervous/anxious. The patient does not have insomnia.        Vitals:  BP 107/76 (BP Site: Left arm, Patient Position: Sitting, Cuff Size: Large)   Pulse 76   Temp 98.1 F (36.7 C) (Oral)   Resp 16   Ht 1.613 m (5' 3.5")   Wt 83.9 kg (185 lb)   BMI 32.26 kg/m      Objective:     Physical Exam:  Physical Exam  Vitals signs and nursing note reviewed.   Constitutional:       Appearance: Normal appearance.   Neck:      Musculoskeletal: Normal range of motion and neck supple.   Cardiovascular:      Rate and Rhythm: Normal rate and regular rhythm.   Pulmonary:      Effort: Pulmonary effort is normal.      Breath sounds: Normal breath sounds.   Abdominal:      General: Abdomen is flat.      Palpations: Abdomen is soft.   Neurological:      Mental Status: She is alert.   Psychiatric:         Mood and Affect: Mood normal.         Behavior: Behavior normal.         Thought Content: Thought content normal.          Assessment:     1. Anxiety  - ALPRAZolam (XANAX) 0.5 MG tablet; Take 1 tablet (0.5 mg total) by mouth 3 (three) times daily as needed for Sleep or Anxiety  Dispense: 30 tablet; Refill: 0      Risk & Benefits of the new medication(s) were explained to the pt (and family) who appeared to understand & agree to the treatment plan.  Alois Mincer Angelene Giovanni, MD

## 2019-02-17 ENCOUNTER — Encounter (INDEPENDENT_AMBULATORY_CARE_PROVIDER_SITE_OTHER): Payer: Self-pay | Admitting: Internal Medicine

## 2019-03-03 ENCOUNTER — Ambulatory Visit (INDEPENDENT_AMBULATORY_CARE_PROVIDER_SITE_OTHER): Payer: BC Managed Care – PPO | Admitting: Gastroenterology

## 2019-03-06 ENCOUNTER — Other Ambulatory Visit (INDEPENDENT_AMBULATORY_CARE_PROVIDER_SITE_OTHER): Payer: Self-pay | Admitting: Internal Medicine

## 2019-03-06 DIAGNOSIS — F419 Anxiety disorder, unspecified: Secondary | ICD-10-CM

## 2019-03-11 ENCOUNTER — Other Ambulatory Visit (INDEPENDENT_AMBULATORY_CARE_PROVIDER_SITE_OTHER): Payer: Self-pay | Admitting: Internal Medicine

## 2019-03-11 MED ORDER — THYROID 30 MG PO TABS
30.0000 mg | ORAL_TABLET | Freq: Every day | ORAL | 1 refills | Status: DC
Start: 2019-03-11 — End: 2019-09-10

## 2019-03-17 ENCOUNTER — Encounter (INDEPENDENT_AMBULATORY_CARE_PROVIDER_SITE_OTHER): Payer: Self-pay | Admitting: Internal Medicine

## 2019-03-24 ENCOUNTER — Other Ambulatory Visit (INDEPENDENT_AMBULATORY_CARE_PROVIDER_SITE_OTHER): Payer: Self-pay | Admitting: Internal Medicine

## 2019-03-24 DIAGNOSIS — Z8619 Personal history of other infectious and parasitic diseases: Secondary | ICD-10-CM

## 2019-03-24 MED ORDER — VALACYCLOVIR HCL 1 G PO TABS
ORAL_TABLET | ORAL | 0 refills | Status: DC
Start: 2019-03-24 — End: 2019-06-11

## 2019-03-25 ENCOUNTER — Encounter (INDEPENDENT_AMBULATORY_CARE_PROVIDER_SITE_OTHER): Payer: Self-pay | Admitting: Gastroenterology

## 2019-03-31 ENCOUNTER — Telehealth (INDEPENDENT_AMBULATORY_CARE_PROVIDER_SITE_OTHER): Payer: BC Managed Care – PPO | Admitting: Gastroenterology

## 2019-03-31 ENCOUNTER — Encounter (INDEPENDENT_AMBULATORY_CARE_PROVIDER_SITE_OTHER): Payer: Self-pay | Admitting: Gastroenterology

## 2019-03-31 DIAGNOSIS — A084 Viral intestinal infection, unspecified: Secondary | ICD-10-CM

## 2019-03-31 NOTE — Progress Notes (Signed)
Hassie Bruce, M.D.  [facility]  [address]  [place], [place].  [address]  Phone:[phone]  [phone]          Date Time: 03/31/2019 8:54 AM   Patient Name: Gwendolyn [name] D  Referring Physician:         Reason for Consultation:   Change in bowel habits  Verbal consent has been obtained from the patient to conduct a telephone or video visit to minimize exposure to COVID-19.    History:   IVAL BASQUEZ is a 58 y.o. female who presents to the office for further evaluation of her GI symptoms.    Risk factors for colon cancer: age 58 or older    Current symptoms: None.  However, she had symptoms dating back to late January which started with vomiting.  It was intermittent in nature.  She went to the ER on one occasion.  The patient developed diarrhea several weeks later that persisted for a month.  Imodium gave some relief.  She also had gas and a sulfur taste in her mouth.  Her symptoms have resolved.    Symptoms specifically denied: Abdominal pain, Bleeding, Constipation, Diarrhea, Fever, Nausea/vomiting and Weight loss    Past history of other GI diseases:none    Prior colonoscopy: yes    If yes, when? 2018.  Findings? Normal per patient.  Additional Notes: Overall, the patient feels much better.    Past Medical History:     Past Medical History:   Diagnosis Date   . Anxiety    . Arrhythmia     Occasional SVT. (Happens rarely)    . Arthritis    . Calculus of kidney    . Depression    . Headache    . Health care maintenance very nice patient     Dr Fayrene Helper husband is my patient  Colo done 2015 due 2020 Pap 2015  Mammogram 2015   . Herpes    . Hypothyroidism    . Low back pain    . Ovarian cyst    . Post-operative nausea and vomiting     APFEL 3, after last laparoscopy woke up w/ SEVERE pain   . Procreative management    . SVT (supraventricular tachycardia)        Past Surgical History:     Past Surgical History:   Procedure Laterality Date   . COLONOSCOPY     . FOOT SURGERY      . LAPAROSCOPIC, APPENDECTOMY N/A 07/13/2015    Procedure: LAPAROSCOPIC, APPENDECTOMY;  Surgeon: Delena Serve, MD;  Location: [facility];  Service: General;  Laterality: N/A;   . LAPAROSCOPIC, ENTEROLYSIS N/A 07/13/2015    Procedure: LAPAROSCOPIC, LYSIS OF ADHESIONS;  Surgeon: Delena Serve, MD;  [facility];  Service: General;  Laterality: N/A;   . LAPAROSCOPIC, LYSIS, ADHESIONS N/A 11/22/2014    Procedure: LAPAROSCOPIC, LYSIS, ADHESIONS;  Surgeon: Waldon Merl, MD;  [facility];  Service: Gynecology;  Laterality: N/A;   . LAPAROSCOPIC, OPERATIVE N/A 11/22/2014    Procedure: LAPAROSCOPIC OVARIAN CYSTOTOMY;  Surgeon: Waldon Merl, MD;  [facility];  Service: Gynecology;  Laterality: N/A;   . LAPAROSCOPIC, OPERATIVE N/A 07/13/2015    Procedure: LAPAROSCOPIC  SALPINGO - OOPHORECTOMY;  Surgeon: Ronalee Red, MD;  [facility]: [facility];  Service: Gynecology;  Laterality: N/A;   . LAPAROSCOPY, DIAGNOSTIC N/A 07/13/2015    Procedure: LAPAROSCOPY, DIAGNOSTIC;  Surgeon: Delena Serve, MD;  [facility];  Service: General;  Laterality: N/A;   . REDUCTION MAMMAPLASTY Bilateral 2005   . REPAIR, LOWER EXTREMITY TENDON Right 05/13/2017    Procedure: RIGHT PERONEAL BREVIS REPAIR AND TENODESIS; RIGHT FIBULA GROOVE DEEPENING.;  Surgeon: Kathi Der, MD;  Location: [facility];  Service: Orthopedics;  Laterality: Right;   . TONSILLECTOMY         Family History:     Family History   Problem Relation Age of Onset   . Hyperlipidemia Mother    . Breast cancer Mother    . Hyperlipidemia Father    . Skin cancer Sister    . Breast cancer Sister 48   . Diabetes Sister    . Hyperlipidemia Sister    . Heart attack Brother    . Hyperlipidemia Brother    . Breast cancer Maternal Aunt 54   . Cervical cancer Maternal Aunt    . Lung cancer Maternal Uncle    . Brain cancer Paternal Uncle    . Diabetes Maternal Grandmother    . Diabetes Paternal Grandfather    .  Cancer Cousin    . Colon cancer Neg Hx    . Ovarian cancer Neg Hx        Social History:     Social History     Socioeconomic History   . Marital status: Married     Spouse name: Not on file   . Number of children: Not on file   . Years of education: Not on file   . Highest education level: Not on file   Occupational History   . Not on file   Social Needs   . Financial resource strain: Not on file   . Food insecurity:     Worry: Not on file     Inability: Not on file   . Transportation needs:     Medical: Not on file     Non-medical: Not on file   Tobacco Use   . Smoking status: Never Smoker   . Smokeless tobacco: Never Used   Substance and Sexual Activity   . Alcohol use: Yes     Alcohol/week: 1.0 standard drinks     Types: 1 Standard drinks or equivalent per week     Comment: rare   . Drug use: No   . Sexual activity: Yes     Partners: Male     Birth control/protection: None   Lifestyle   . Physical activity:     Days per week: Not on file     Minutes per session: Not on file   . Stress: Not on file   Relationships   . Social connections:     Talks on phone: Not on file     Gets together: Not on file     Attends religious service: Not on file     Active member of club or organization: Not on file     Attends meetings of clubs or organizations: Not on file     Relationship status: Not on file   . Intimate partner violence:     Fear of current or ex partner: Not on file     Emotionally abused: Not on file     Physically abused: Not on file     Forced sexual activity: Not on file   Other Topics Concern   . Not on file   Social History Narrative   . Not on file  Allergies:     Allergies   Allergen Reactions   . Beef-Derived Products Anaphylaxis     And Pork products   . Erythromycin Hives   . Other Swelling and Respiratory Distress     Bee sting    . Penicillins Hives       Medications:     Current Outpatient Medications:   .  ALPRAZolam (XANAX) 0.5 MG tablet, Take 1 tablet (0.5 mg total) by mouth 3 (three) times  daily as needed for Sleep or Anxiety, Disp: 30 tablet, Rfl: 0  .  buPROPion XL (WELLBUTRIN XL) 300 MG 24 hr tablet, TAKE 1 TABLET(300 MG) BY MOUTH DAILY, Disp: 30 tablet, Rfl: 0  .  Eszopiclone 3 MG tablet, Take 1 tablet (3 mg total) by mouth nightly.Take immediately before bedtime, Disp: 30 tablet, Rfl: 0  .  gabapentin (NEURONTIN) 100 MG capsule, TK 3 CS PO QHS, Disp: , Rfl: 5  .  thyroid (ARMOUR THYROID) 30 MG tablet, Take 1 tablet (30 mg total) by mouth daily, Disp: 90 tablet, Rfl: 1  .  tiZANidine (ZANAFLEX) 4 MG tablet, Take 4 mg by mouth as needed.  , Disp: , Rfl: 1  .  TURMERIC PO, Take by mouth, Disp: , Rfl:   .  valacyclovir (VALTREX) 1000 MG tablet, TAKE 1 TABLET(1000 MG) BY MOUTH TWICE DAILY, Disp: 60 tablet, Rfl: 0      Review of Systems:   Constitutional:No fever,chills,weight loss or gain.  Integument:No skin rashes or lesions.  Eyes:No changes in vision  ENMT:No changes in hearing,nasal discharge,sore throat,oral lesions.  Respiratory:No shortness of breath or cough.  Cardiovascular:No chest pain or palpitations.  Genitourinary:No nocturia,urinary frequency, or dysuria.  Gastrointestinal:See HPI.  Musculoskeletal:No back or joint pain.  Neurologic:No migraine headaches,numbness, or tingling.  Endocrine:No polydipsia,cold or heat intolerance.  Hematologic:No bleeding or bruising.  Psychiatric:No depression or anxiety.     Physical Exam:   There were no vitals filed for this visit.      No obvious abnormalities on video exam.    Assessment:   Probable gastroenteritis  (Normal colonoscopy in 2018)    Plan:   No need for further intervention at this time.  Old records and labs reviewed in detail  Recommend daily probiotic.     Time spent in discussion: 35 minutes    Signed by: Estanislado Emms, MD

## 2019-04-04 ENCOUNTER — Encounter (INDEPENDENT_AMBULATORY_CARE_PROVIDER_SITE_OTHER): Payer: Self-pay | Admitting: Gastroenterology

## 2019-04-08 ENCOUNTER — Other Ambulatory Visit (INDEPENDENT_AMBULATORY_CARE_PROVIDER_SITE_OTHER): Payer: Self-pay | Admitting: Internal Medicine

## 2019-04-08 DIAGNOSIS — F419 Anxiety disorder, unspecified: Secondary | ICD-10-CM

## 2019-04-08 MED ORDER — BUPROPION HCL ER (XL) 300 MG PO TB24
300.00 mg | ORAL_TABLET | Freq: Every day | ORAL | 0 refills | Status: DC
Start: 2019-04-08 — End: 2019-05-17

## 2019-04-28 ENCOUNTER — Encounter (INDEPENDENT_AMBULATORY_CARE_PROVIDER_SITE_OTHER): Payer: Self-pay | Admitting: Internal Medicine

## 2019-04-28 NOTE — Progress Notes (Signed)
Lvmtcb to schedule f/u v/v with pcp

## 2019-05-15 ENCOUNTER — Encounter (INDEPENDENT_AMBULATORY_CARE_PROVIDER_SITE_OTHER): Payer: Self-pay | Admitting: Internal Medicine

## 2019-05-17 ENCOUNTER — Other Ambulatory Visit (INDEPENDENT_AMBULATORY_CARE_PROVIDER_SITE_OTHER): Payer: Self-pay | Admitting: Internal Medicine

## 2019-05-17 DIAGNOSIS — F419 Anxiety disorder, unspecified: Secondary | ICD-10-CM

## 2019-05-18 MED ORDER — BUPROPION HCL ER (XL) 300 MG PO TB24
300.00 mg | ORAL_TABLET | Freq: Every day | ORAL | 0 refills | Status: DC
Start: 2019-05-18 — End: 2019-06-30

## 2019-06-01 ENCOUNTER — Encounter (INDEPENDENT_AMBULATORY_CARE_PROVIDER_SITE_OTHER): Payer: Self-pay | Admitting: Internal Medicine

## 2019-06-11 ENCOUNTER — Telehealth (INDEPENDENT_AMBULATORY_CARE_PROVIDER_SITE_OTHER): Payer: BC Managed Care – PPO | Admitting: Internal Medicine

## 2019-06-11 ENCOUNTER — Encounter (INDEPENDENT_AMBULATORY_CARE_PROVIDER_SITE_OTHER): Payer: Self-pay | Admitting: Internal Medicine

## 2019-06-11 DIAGNOSIS — Z8619 Personal history of other infectious and parasitic diseases: Secondary | ICD-10-CM

## 2019-06-11 DIAGNOSIS — F419 Anxiety disorder, unspecified: Secondary | ICD-10-CM

## 2019-06-11 MED ORDER — VALACYCLOVIR HCL 1 G PO TABS
ORAL_TABLET | ORAL | 0 refills | Status: AC
Start: 2019-06-11 — End: ?

## 2019-06-11 MED ORDER — ALPRAZOLAM 0.5 MG PO TABS
0.50 mg | ORAL_TABLET | Freq: Three times a day (TID) | ORAL | 0 refills | Status: AC | PRN
Start: 2019-06-11 — End: 2019-07-11

## 2019-06-11 NOTE — Progress Notes (Signed)
Subjective:      Patient ID: Gwendolyn [name] is a 58 y.o. female.    Chief Complaint:  Chief Complaint   Patient presents with   . Anxiety       HPI:  She also needs refill request for cold sores    Anxiety   Presents for follow-up visit. Patient reports no chest pain, compulsions, depressed mood, dry mouth, excessive worry, feeling of choking, hyperventilation, insomnia, malaise, nausea, obsessions, panic, restlessness or suicidal ideas.           Problem List:  Patient Active Problem List   Diagnosis   . Right ovarian cyst   . Pain, female pelvic   . Obesity (BMI 30-39.9)       Current Medications:  Current Outpatient Medications   Medication Sig Dispense Refill   . ALPRAZolam (XANAX) 0.5 MG tablet Take 1 tablet (0.5 mg total) by mouth 3 (three) times daily as needed for Sleep or Anxiety 30 tablet 0   . buPROPion XL (WELLBUTRIN XL) 300 MG 24 hr tablet Take 1 tablet (300 mg total) by mouth daily 30 tablet 0   . Eszopiclone 3 MG tablet Take 1 tablet (3 mg total) by mouth nightly.Take immediately before bedtime 30 tablet 0   . gabapentin (NEURONTIN) 100 MG capsule TK 3 CS PO QHS  5   . thyroid (ARMOUR THYROID) 30 MG tablet Take 1 tablet (30 mg total) by mouth daily 90 tablet 1   . tiZANidine (ZANAFLEX) 4 MG tablet Take 4 mg by mouth as needed.     1   . TURMERIC PO Take by mouth     . valacyclovir (VALTREX) 1000 MG tablet TAKE 1 TABLET(1000 MG) BY MOUTH TWICE DAILY 60 tablet 0     No current facility-administered medications for this visit.        Allergies:  Allergies   Allergen Reactions   . Beef-Derived Products Anaphylaxis     And Pork products   . Erythromycin Hives   . Other Swelling and Respiratory Distress     Bee sting    . Penicillins Hives       Past Medical History:  Past Medical History:   Diagnosis Date   . Anxiety    . Arrhythmia     Occasional SVT. (Happens rarely)    . Arthritis    . Calculus of kidney    . Depression    . Headache    . Health care maintenance very nice patient     Dr Fayrene Helper husband  is my patient  Colo done 2015 due 2020 Pap 2015  Mammogram 2015   . Herpes    . Hypothyroidism    . Low back pain    . Ovarian cyst    . Post-operative nausea and vomiting     APFEL 3, after last laparoscopy woke up w/ SEVERE pain   . Procreative management    . SVT (supraventricular tachycardia)        Past Surgical History:  Past Surgical History:   Procedure Laterality Date   . COLONOSCOPY     . FOOT SURGERY     . LAPAROSCOPIC, APPENDECTOMY N/A 07/13/2015    Procedure: LAPAROSCOPIC, APPENDECTOMY;  Surgeon: Delena Serve, MD;  Location: [facility];  Service: General;  Laterality: N/A;   . LAPAROSCOPIC, ENTEROLYSIS N/A 07/13/2015    Procedure: LAPAROSCOPIC, LYSIS OF ADHESIONS;  Surgeon: Delena Serve, MD;  Location: [facility];  Service: General;  Laterality:  N/A;   . LAPAROSCOPIC, LYSIS, ADHESIONS N/A 11/22/2014    Procedure: LAPAROSCOPIC, LYSIS, ADHESIONS;  Surgeon: Waldon Merl, MD;  Location: [facility];  Service: Gynecology;  Laterality: N/A;   . LAPAROSCOPIC, OPERATIVE N/A 11/22/2014    Procedure: LAPAROSCOPIC OVARIAN CYSTOTOMY;  Surgeon: Waldon Merl, MD;  Location: [facility];  Service: Gynecology;  Laterality: N/A;   . LAPAROSCOPIC, OPERATIVE N/A 07/13/2015    Procedure: LAPAROSCOPIC  SALPINGO - OOPHORECTOMY;  Surgeon: Ronalee Red, MD;  Location: [facility];  Service: Gynecology;  Laterality: N/A;   . LAPAROSCOPY, DIAGNOSTIC N/A 07/13/2015    Procedure: LAPAROSCOPY, DIAGNOSTIC;  Surgeon: Delena Serve, MD;  Location: [facility];  Service: General;  Laterality: N/A;   . REDUCTION MAMMAPLASTY Bilateral 2005   . REPAIR, LOWER EXTREMITY TENDON Right 05/13/2017    Procedure: RIGHT PERONEAL BREVIS REPAIR AND TENODESIS; RIGHT FIBULA GROOVE DEEPENING.;  Surgeon: Kathi Der, MD;  Location: [facility];  Service: Orthopedics;  Laterality: Right;   . TONSILLECTOMY         Family History:  Family History   Problem Relation Age of Onset   . Hyperlipidemia Mother    .  Breast cancer Mother    . Hyperlipidemia Father    . Skin cancer Sister    . Breast cancer Sister 80   . Diabetes Sister    . Hyperlipidemia Sister    . Heart attack Brother    . Hyperlipidemia Brother    . Breast cancer Maternal Aunt 54   . Cervical cancer Maternal Aunt    . Lung cancer Maternal Uncle    . Brain cancer Paternal Uncle    . Diabetes Maternal Grandmother    . Diabetes Paternal Grandfather    . Cancer Cousin    . Colon cancer Neg Hx    . Ovarian cancer Neg Hx        Social History:  Social History     Socioeconomic History   . Marital status: Married     Spouse name: Not on file   . Number of children: Not on file   . Years of education: Not on file   . Highest education level: Not on file   Occupational History   . Not on file   Social Needs   . Financial resource strain: Not on file   . Food insecurity     Worry: Not on file     Inability: Not on file   . Transportation needs     Medical: Not on file     Non-medical: Not on file   Tobacco Use   . Smoking status: Never Smoker   . Smokeless tobacco: Never Used   Substance and Sexual Activity   . Alcohol use: Yes     Alcohol/week: 1.0 standard drinks     Types: 1 Standard drinks or equivalent per week     Comment: rare   . Drug use: No   . Sexual activity: Yes     Partners: Male     Birth control/protection: None   Lifestyle   . Physical activity     Days per week: Not on file     Minutes per session: Not on file   . Stress: Not on file   Relationships   . Social Wellsite geologist on phone: Not on file     Gets together: Not on file     Attends religious service: Not  on file     Active member of club or organization: Not on file     Attends meetings of clubs or organizations: Not on file     Relationship status: Not on file   . Intimate partner violence     Fear of current or ex partner: Not on file     Emotionally abused: Not on file     Physically abused: Not on file     Forced sexual activity: Not on file   Other Topics Concern   . Not on file    Social History Narrative   . Not on file       The following sections were reviewed this encounter by the provider:   Tobacco  Allergies  Meds  Problems  Med Hx  Surg Hx  Fam Hx  Soc Hx          ROS:  Review of Systems   Constitutional: Negative.    HENT: Negative.    Cardiovascular: Negative for chest pain.   Gastrointestinal: Negative for nausea.   Psychiatric/Behavioral: Negative.  Negative for suicidal ideas. The patient does not have insomnia.        Vitals:  There were no vitals taken for this visit.     Objective:     Physical Exam:  Physical Exam     Assessment:     1. Anxiety       On wellbutrin   C/w meds  1. Anxiety     2. History of cold sores  valacyclovir (VALTREX) 1000 MG tablet     Risk & Benefits of the new medication(s) were explained to the pt (and family) who appeared to understand & agree to the treatment plan.  Sharlett Lienemann Angelene Giovanni, MD

## 2019-06-16 ENCOUNTER — Other Ambulatory Visit: Payer: Self-pay | Admitting: Registered Nurse

## 2019-06-16 DIAGNOSIS — M533 Sacrococcygeal disorders, not elsewhere classified: Secondary | ICD-10-CM

## 2019-06-16 DIAGNOSIS — M5136 Other intervertebral disc degeneration, lumbar region: Secondary | ICD-10-CM

## 2019-06-18 ENCOUNTER — Encounter (INDEPENDENT_AMBULATORY_CARE_PROVIDER_SITE_OTHER): Payer: Self-pay | Admitting: Internal Medicine

## 2019-06-22 ENCOUNTER — Encounter (INDEPENDENT_AMBULATORY_CARE_PROVIDER_SITE_OTHER): Payer: Self-pay | Admitting: Internal Medicine

## 2019-06-26 ENCOUNTER — Encounter (INDEPENDENT_AMBULATORY_CARE_PROVIDER_SITE_OTHER): Payer: Self-pay | Admitting: Internal Medicine

## 2019-06-26 DIAGNOSIS — F419 Anxiety disorder, unspecified: Secondary | ICD-10-CM

## 2019-06-28 ENCOUNTER — Ambulatory Visit: Payer: BC Managed Care – PPO

## 2019-06-30 ENCOUNTER — Other Ambulatory Visit (INDEPENDENT_AMBULATORY_CARE_PROVIDER_SITE_OTHER): Payer: Self-pay

## 2019-06-30 DIAGNOSIS — F419 Anxiety disorder, unspecified: Secondary | ICD-10-CM

## 2019-06-30 MED ORDER — BUPROPION HCL ER (XL) 300 MG PO TB24
300.00 mg | ORAL_TABLET | Freq: Every day | ORAL | 0 refills | Status: DC
Start: 2019-06-30 — End: 2019-07-21

## 2019-06-30 NOTE — Telephone Encounter (Signed)
Pt req refill bupropion via mychart   Pended order

## 2019-06-30 NOTE — Progress Notes (Signed)
Sent refill encounter

## 2019-07-05 ENCOUNTER — Other Ambulatory Visit: Payer: Self-pay | Admitting: Registered Nurse

## 2019-07-05 ENCOUNTER — Ambulatory Visit
Admission: RE | Admit: 2019-07-05 | Discharge: 2019-07-05 | Disposition: A | Discharge status: Home / Self Care | Payer: BC Managed Care – PPO | Source: Ambulatory Visit | Attending: Registered Nurse | Admitting: Registered Nurse

## 2019-07-05 DIAGNOSIS — M533 Sacrococcygeal disorders, not elsewhere classified: Secondary | ICD-10-CM

## 2019-07-07 ENCOUNTER — Ambulatory Visit
Admission: RE | Admit: 2019-07-07 | Discharge: 2019-07-07 | Disposition: A | Discharge status: Home / Self Care | Payer: BC Managed Care – PPO | Source: Ambulatory Visit | Attending: Registered Nurse | Admitting: Registered Nurse

## 2019-07-07 DIAGNOSIS — M5136 Other intervertebral disc degeneration, lumbar region: Secondary | ICD-10-CM

## 2019-07-07 DIAGNOSIS — M533 Sacrococcygeal disorders, not elsewhere classified: Secondary | ICD-10-CM | POA: Insufficient documentation

## 2019-07-08 ENCOUNTER — Other Ambulatory Visit: Payer: BC Managed Care – PPO

## 2019-07-20 ENCOUNTER — Other Ambulatory Visit (INDEPENDENT_AMBULATORY_CARE_PROVIDER_SITE_OTHER): Payer: Self-pay | Admitting: Internal Medicine

## 2019-07-20 DIAGNOSIS — F419 Anxiety disorder, unspecified: Secondary | ICD-10-CM

## 2019-08-20 ENCOUNTER — Other Ambulatory Visit (INDEPENDENT_AMBULATORY_CARE_PROVIDER_SITE_OTHER): Payer: Self-pay | Admitting: Internal Medicine

## 2019-08-20 DIAGNOSIS — F419 Anxiety disorder, unspecified: Secondary | ICD-10-CM

## 2019-09-10 ENCOUNTER — Other Ambulatory Visit (INDEPENDENT_AMBULATORY_CARE_PROVIDER_SITE_OTHER): Payer: Self-pay | Admitting: Internal Medicine

## 2019-09-10 MED ORDER — THYROID 30 MG PO TABS
30.0000 mg | ORAL_TABLET | Freq: Every day | ORAL | 0 refills | Status: DC
Start: 2019-09-10 — End: 2019-11-08

## 2019-10-06 ENCOUNTER — Encounter (INDEPENDENT_AMBULATORY_CARE_PROVIDER_SITE_OTHER): Payer: Self-pay | Admitting: Internal Medicine

## 2019-10-07 ENCOUNTER — Other Ambulatory Visit (INDEPENDENT_AMBULATORY_CARE_PROVIDER_SITE_OTHER): Payer: Self-pay | Admitting: Internal Medicine

## 2019-10-07 DIAGNOSIS — F419 Anxiety disorder, unspecified: Secondary | ICD-10-CM

## 2019-10-07 MED ORDER — BUPROPION HCL ER (XL) 300 MG PO TB24
300.00 mg | ORAL_TABLET | Freq: Every day | ORAL | 0 refills | Status: DC
Start: 2019-10-07 — End: 2019-11-08

## 2019-11-08 ENCOUNTER — Encounter (INDEPENDENT_AMBULATORY_CARE_PROVIDER_SITE_OTHER): Payer: Self-pay

## 2019-11-08 ENCOUNTER — Other Ambulatory Visit (INDEPENDENT_AMBULATORY_CARE_PROVIDER_SITE_OTHER): Payer: Self-pay

## 2019-11-08 ENCOUNTER — Encounter (INDEPENDENT_AMBULATORY_CARE_PROVIDER_SITE_OTHER): Payer: Self-pay | Admitting: Internal Medicine

## 2019-11-08 DIAGNOSIS — F419 Anxiety disorder, unspecified: Secondary | ICD-10-CM

## 2019-11-08 MED ORDER — THYROID 30 MG PO TABS
30.00 mg | ORAL_TABLET | Freq: Every day | ORAL | 0 refills | Status: AC
Start: 2019-11-08 — End: ?

## 2019-11-08 MED ORDER — BUPROPION HCL ER (XL) 300 MG PO TB24
300.00 mg | ORAL_TABLET | Freq: Every day | ORAL | 0 refills | Status: AC
Start: 2019-11-08 — End: ?

## 2019-11-08 NOTE — Progress Notes (Signed)
Conversation: Prescription Question  (Oldest Message First)  Lottie Dawson, Melvern Sample  to Orlene Plum, MD         11/08/19 9:53 AM  I have 2 scripts waiting for approval from your office: Thyroid and Buproprion. Can you please approve?          11/08/19 11:19 AM  Gershon Crane routed this conversation to Me   Me  to [place], Thuman         11/08/19 1:11 PM  Mr. Mang,    I have sent your request to Dr. Celine Mans. Please allow 48-72 hours for approval.      [facility]    This MyChart message has not been read.

## 2019-11-15 ENCOUNTER — Encounter (INDEPENDENT_AMBULATORY_CARE_PROVIDER_SITE_OTHER): Payer: Self-pay | Admitting: Internal Medicine

## 2019-12-08 ENCOUNTER — Other Ambulatory Visit (INDEPENDENT_AMBULATORY_CARE_PROVIDER_SITE_OTHER): Payer: Self-pay | Admitting: Internal Medicine

## 2019-12-08 DIAGNOSIS — F419 Anxiety disorder, unspecified: Secondary | ICD-10-CM

## 2019-12-23 ENCOUNTER — Encounter (INDEPENDENT_AMBULATORY_CARE_PROVIDER_SITE_OTHER): Payer: Self-pay

## 2019-12-29 ENCOUNTER — Other Ambulatory Visit (INDEPENDENT_AMBULATORY_CARE_PROVIDER_SITE_OTHER): Payer: Self-pay

## 2020-01-06 ENCOUNTER — Other Ambulatory Visit (INDEPENDENT_AMBULATORY_CARE_PROVIDER_SITE_OTHER): Payer: Self-pay

## 2020-01-06 DIAGNOSIS — Z8619 Personal history of other infectious and parasitic diseases: Secondary | ICD-10-CM

## 2020-01-07 ENCOUNTER — Telehealth (INDEPENDENT_AMBULATORY_CARE_PROVIDER_SITE_OTHER): Payer: Self-pay | Admitting: Internal Medicine

## 2020-01-07 NOTE — Telephone Encounter (Signed)
SPOKE WITH THE PT, SHE IS RELOCATING TO [place], SHE STATED NO APPT NECESSARY, SHE WILL F/U WITH A PRIMARY CARE PROVIDER THERE.

## 2020-01-10 ENCOUNTER — Other Ambulatory Visit (INDEPENDENT_AMBULATORY_CARE_PROVIDER_SITE_OTHER): Payer: Self-pay

## 2021-02-01 IMAGING — MR MRI LEFT SHOULDER WITHOUT CONTRAST
6 series · 35 of 40 positions shown · IV contrast (gadolinium)
Comparison: None

MRI LEFT SHOULDER WITHOUT CONTRAST, 02/01/2021 [DATE]: 
CLINICAL INDICATION: Left shoulder pain for 3 months. Loss of range of motion.
TECHNIQUE: Multiplanar, multiecho position MR images of the shoulder were 
performed without intravenous gadolinium enhancement.

[Series 201: survey 3 plane · axial · 10.0mm · 1.56mm/px · z∈[-20,+199]mm · 3 of 9 slices shown]
[im 1/9]
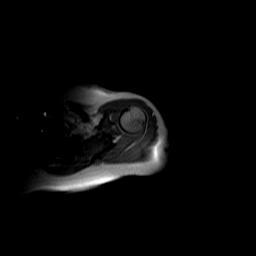
[im 5/9]
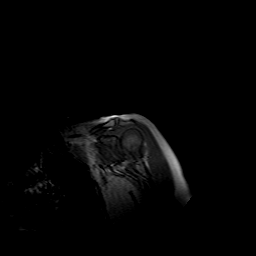
[im 9/9]
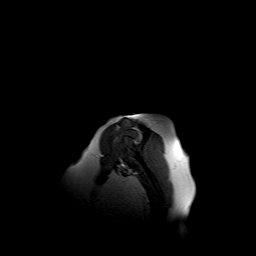

[Series 401: t2w spair sag · oblique · 4.0mm · 0.56mm/px · 7 of 22 slices shown]
[im 1/22]
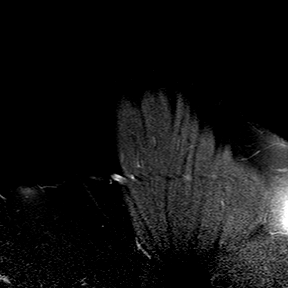
[im 4/22]
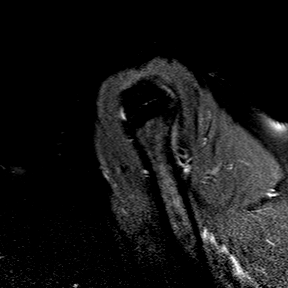
[im 8/22]
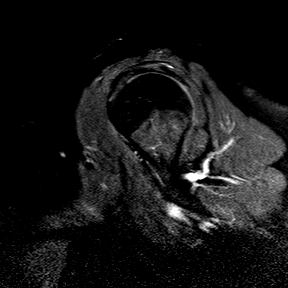
[im 11/22]
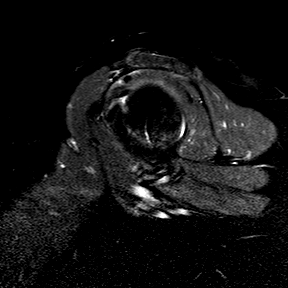
[im 15/22]
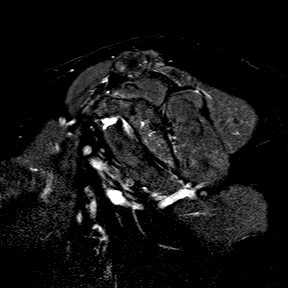
[im 18/22]
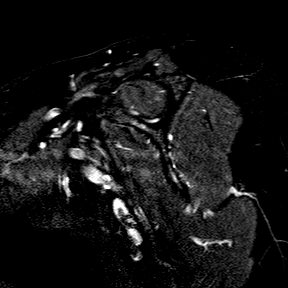
[im 22/22]
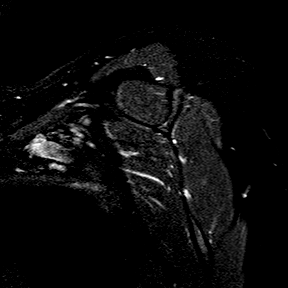

[Series 501: pdw spair ax · axial · 3.0mm · 0.56mm/px · z∈[-87,-4]mm · 9 of 27 slices shown]
[im 1/27]
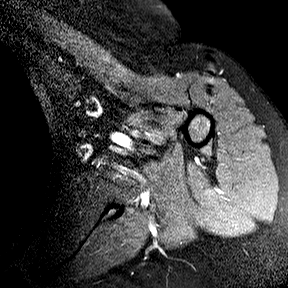
[im 4/27]
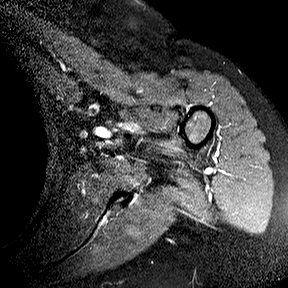
[im 7/27]
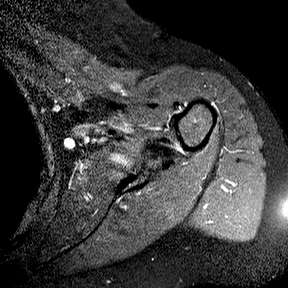
[im 10/27]
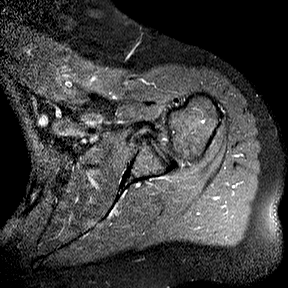
[im 14/27]
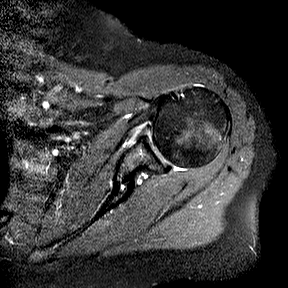
[im 17/27]
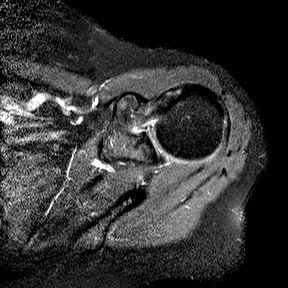
[im 20/27]
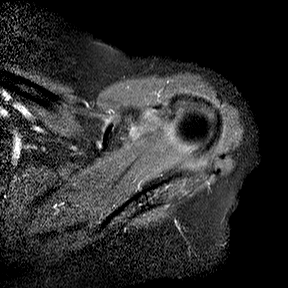
[im 23/27]
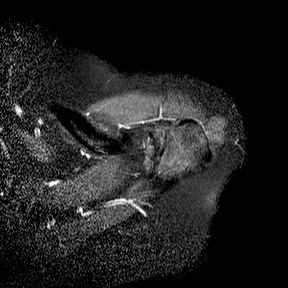
[im 27/27]
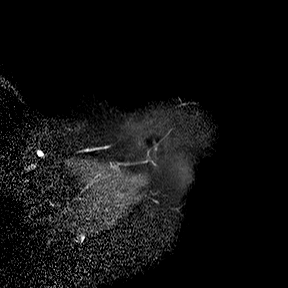

[Series 601: t2w tse cor · oblique · 3.0mm · 0.31mm/px · 7 of 20 slices shown]
[im 1/20]
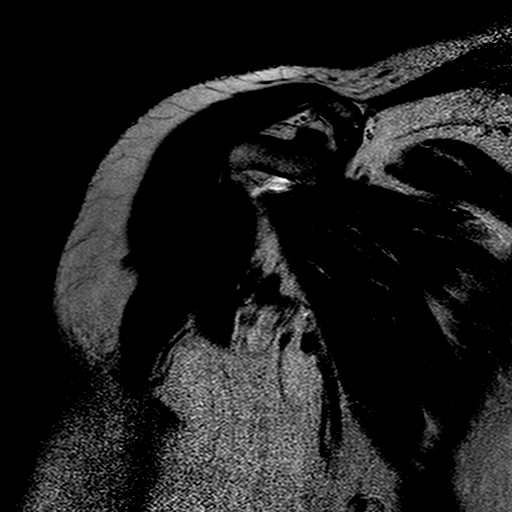
[im 4/20]
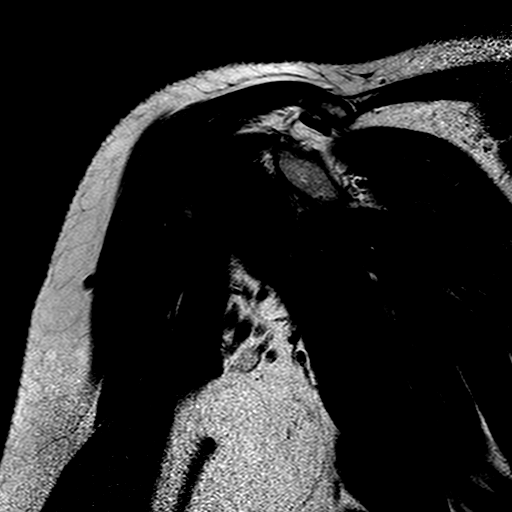
[im 7/20]
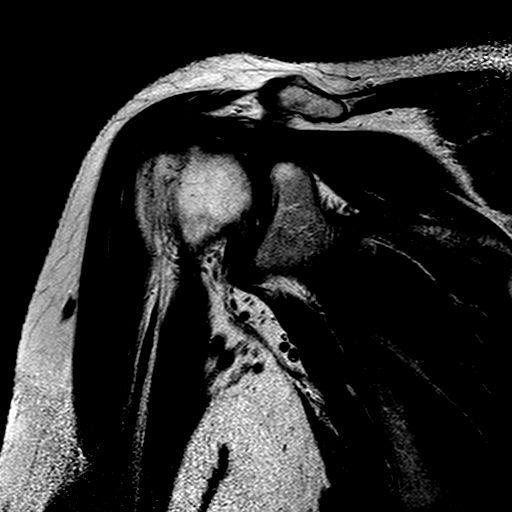
[im 10/20]
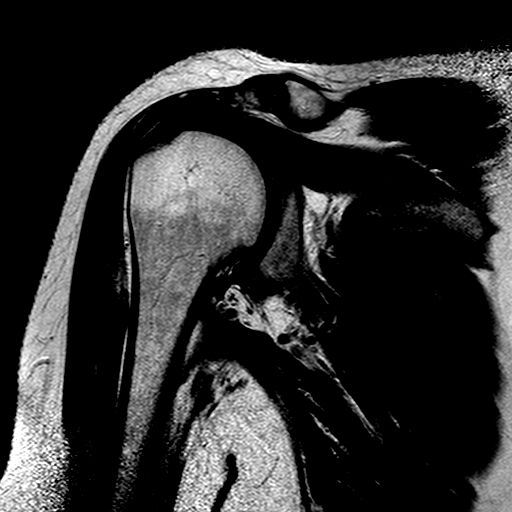
[im 13/20]
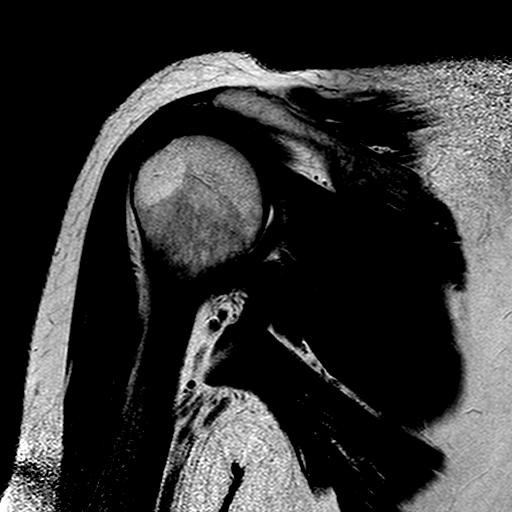
[im 16/20]
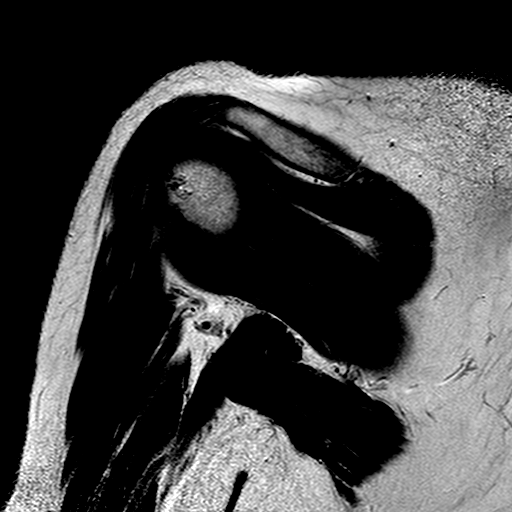
[im 20/20]
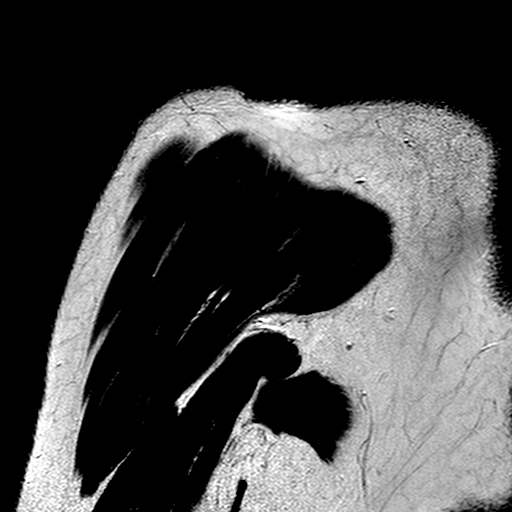

[Series 701: t2w spair cor · oblique · 3.0mm · 0.50mm/px · 7 of 20 slices shown]
[im 1/20]
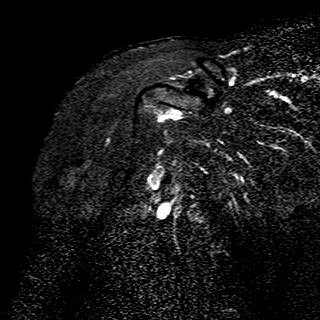
[im 4/20]
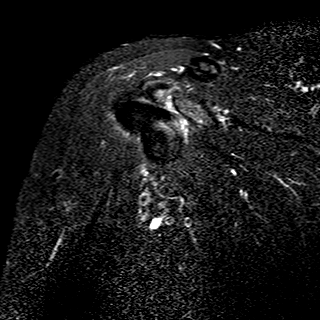
[im 7/20]
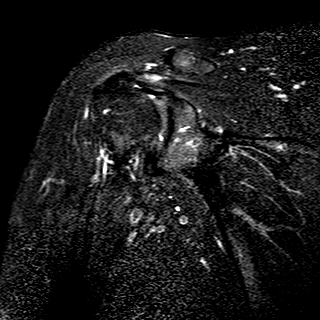
[im 10/20]
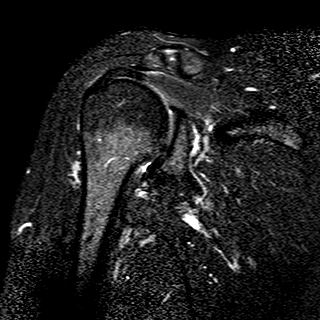
[im 13/20]
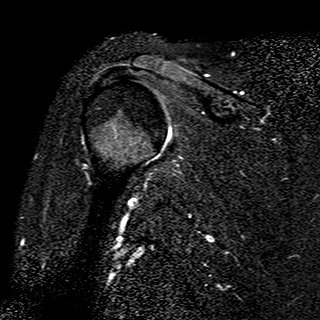
[im 16/20]
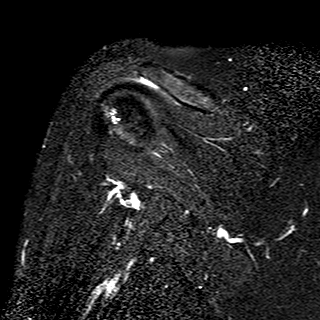
[im 20/20]
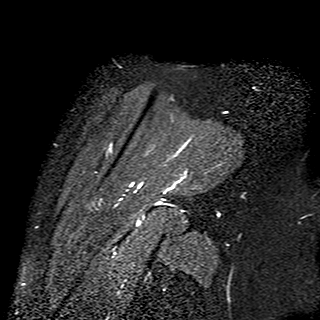

[Series 801: t1w tse sag · oblique · 4.0mm · 0.31mm/px · 2 of 22 slices shown]
[im 1/22]
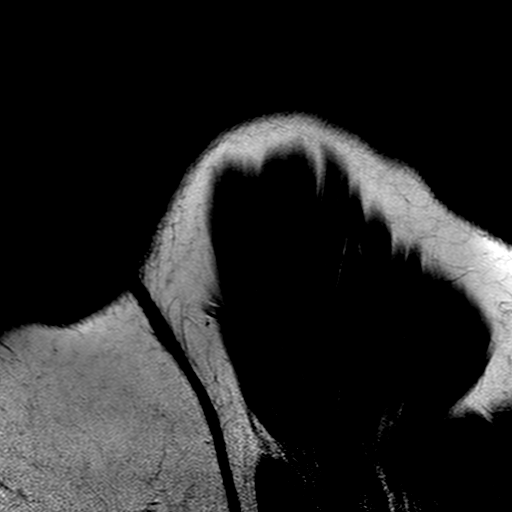
[im 4/22]
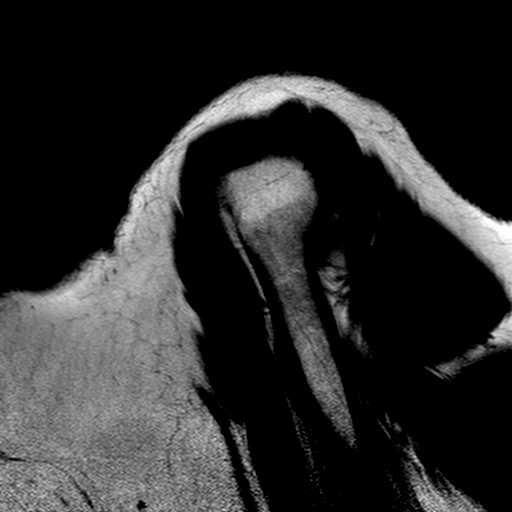

[35 of 40 positions shown; findings below may reference images not displayed]

FINDINGS: ROTATOR CUFF: The supraspinatus, infraspinatus, subscapularis and teres minor 
tendons are intact. The rotator cuff musculature is symmetric without mass, 
signal abnormality or atrophy. 
ACROMIOCLAVICULAR JOINT: Mild hypertrophic changes of the acromioclavicular 
joint with mild mass effect upon the myotendinous juncture of the supraspinatus. 
The coracoacromial ligament is intact with mild spurring at the acromial 
attachment. The acromioclavicular and coracoclavicular ligaments are preserved. 
Slight fluid within the subacromial subdeltoid space. 
GLENOHUMERAL JOINT: The humeral head is well located within the glenoid fossa. 
Articular cartilage is preserved.  There is congenital variant sublabral foramen 
or hole of the anterior superior labrum. No paralabral cyst. The intra-articular 
portion of the long head of the biceps tendon is negative. Trace shoulder joint 
effusion. 
BONES: Bone marrow signal intensity is negative for fracture. No Hill-Sachs 
defect. Subcortical cystic change of the humeral head. 
ADDITIONAL FINDINGS: The axillary region is negative. Subcutaneous tissues are 
negative.
IMPRESSION: 1.  Mild AC joint arthropathy with mild narrowing of the supraspinatus outlet. 
2.  Trace subacromial/subdeltoid fluid without overt bursitis. 
3.  No rotator cuff tear.

## 2022-02-25 IMAGING — MR MRI RIGHT SHOULDER WITHOUT CONTRAST
4 of 6 series · 23 of 40 positions shown · IV contrast (gadolinium)
Comparison: None

________________________________________________________________________________________________ 
MRI RIGHT SHOULDER WITHOUT CONTRAST, 02/25/2022 [DATE]: 
CLINICAL INDICATION: Osteoarthritis of joint of right shoulder region
TECHNIQUE: Multiplanar, multiecho position MR images of the shoulder were 
performed without intravenous gadolinium enhancement. Patient was scanned on a 
1.5T magnet.

[Series 201: survey right · axial · right · 10.0mm · 0.71mm/px · z∈[-40,+125]mm · 4 of 15 slices shown]
[im 1/15]
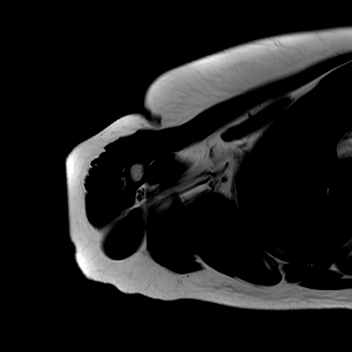
[im 5/15]
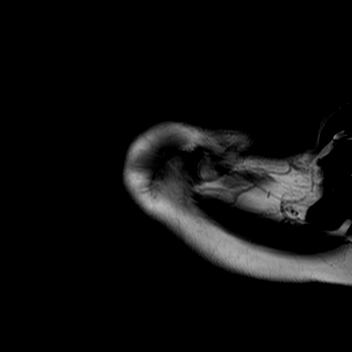
[im 10/15]
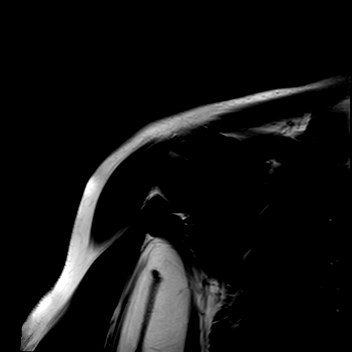
[im 15/15]
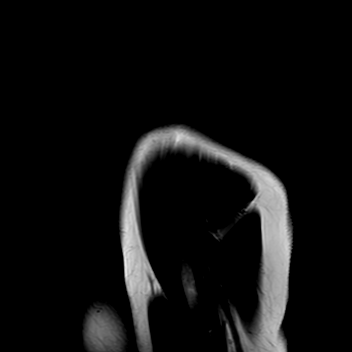

[Series 301: (person_name)_(person_name)_(person_name) right · axial · right · 3.0mm · 0.35mm/px · z∈[-41,+46]mm · 8 of 28 slices shown]
[im 1/28]
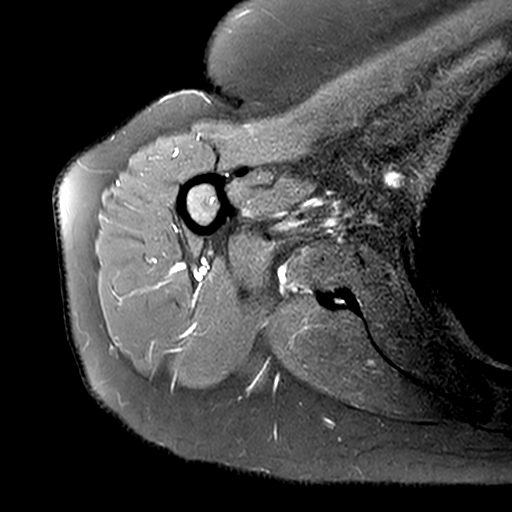
[im 4/28]
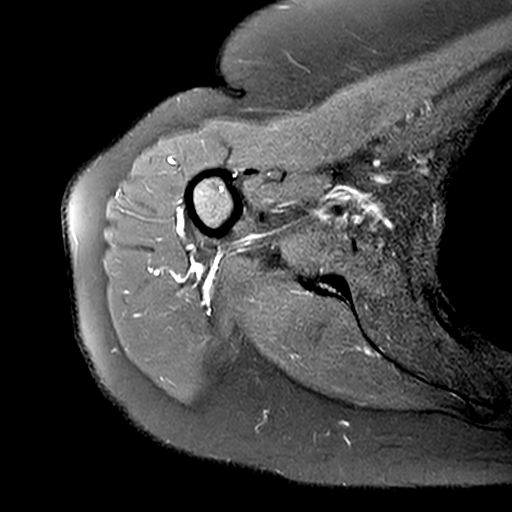
[im 8/28]
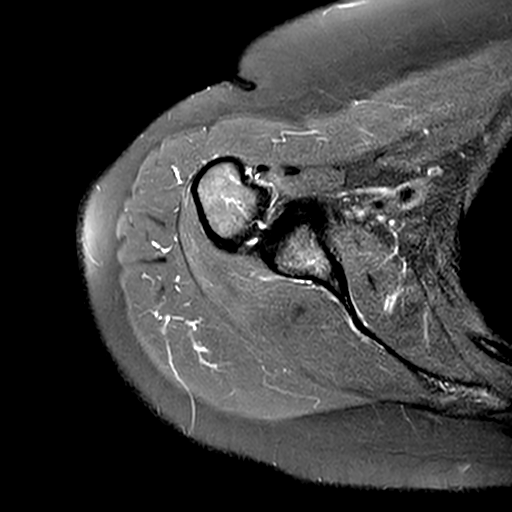
[im 12/28]
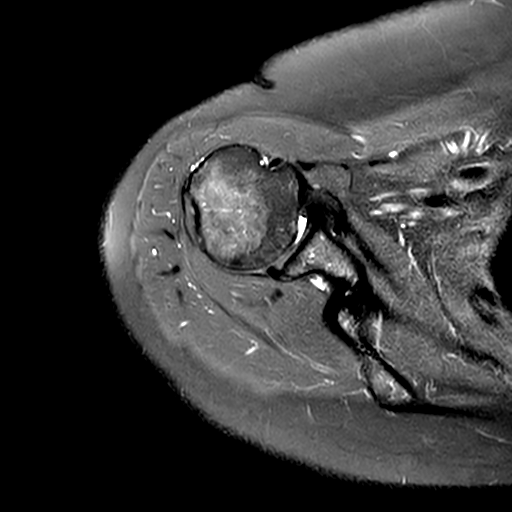
[im 16/28]
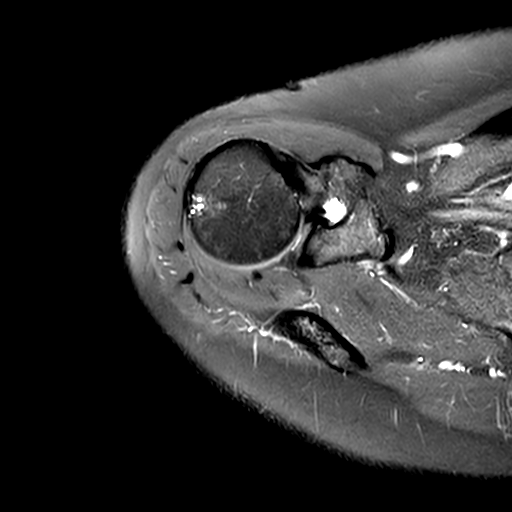
[im 20/28]
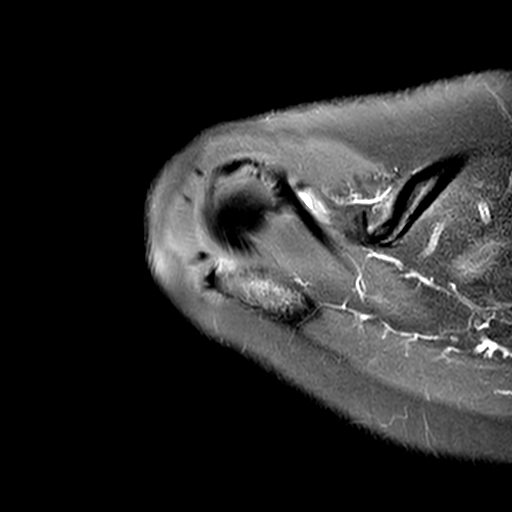
[im 24/28]
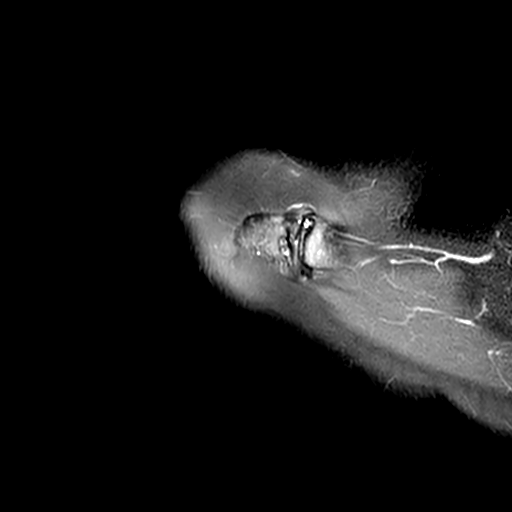
[im 28/28]
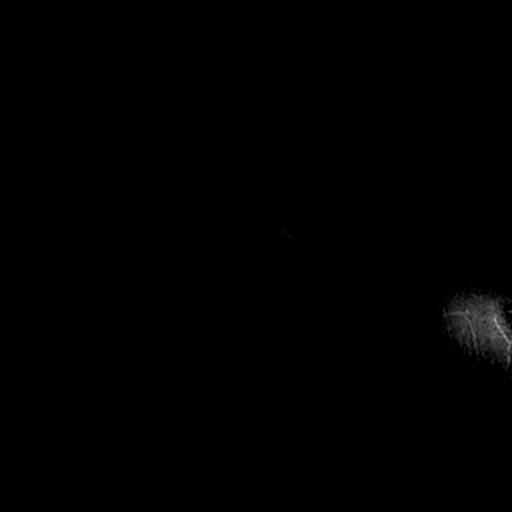

[Series 401: t2_fs_sag right · oblique · right · 3.0mm · 0.37mm/px · 8 of 30 slices shown]
[im 1/30]
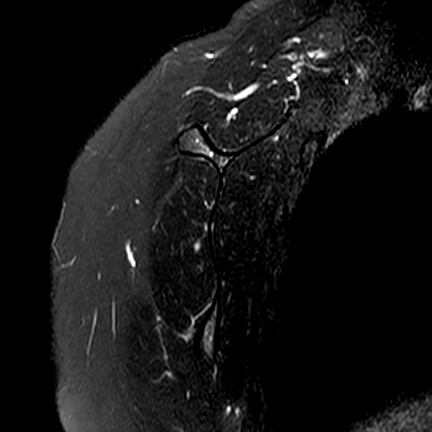
[im 5/30]
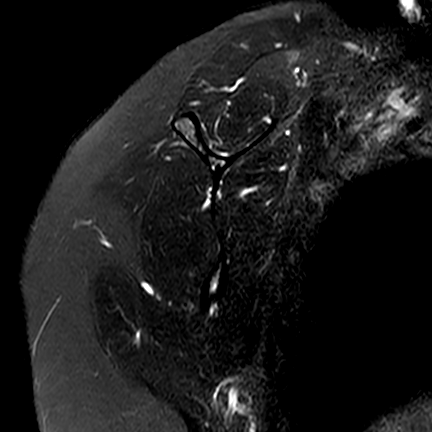
[im 9/30]
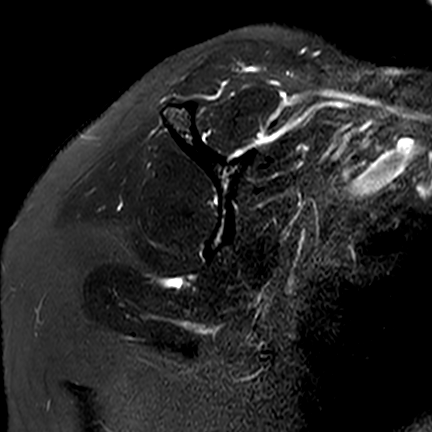
[im 13/30]
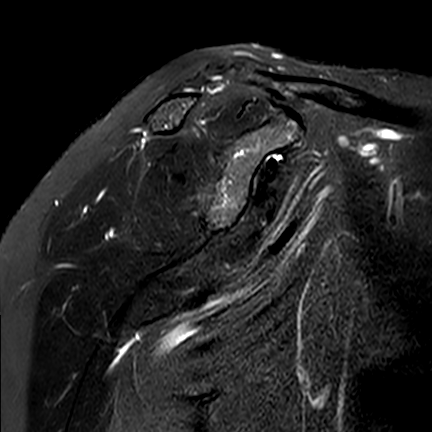
[im 17/30]
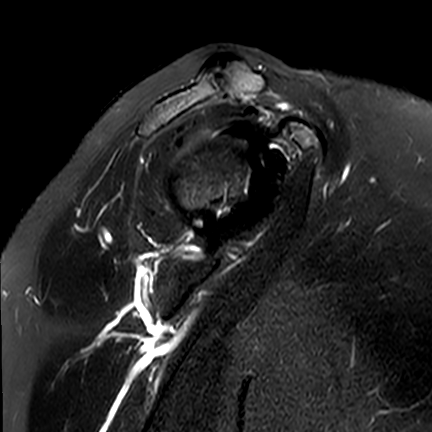
[im 21/30]
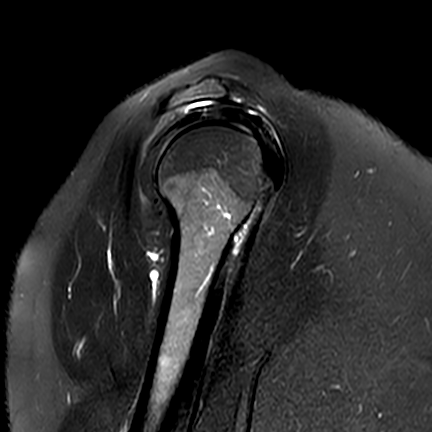
[im 25/30]
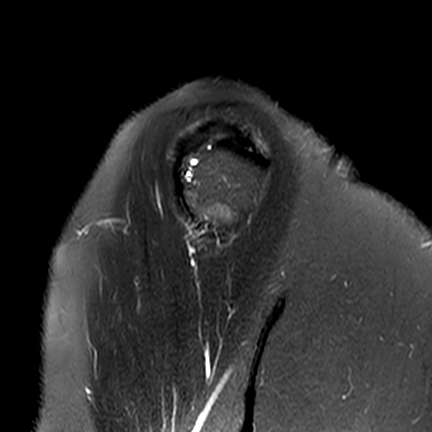
[im 30/30]
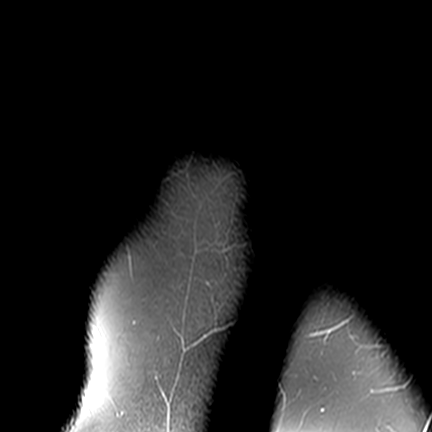

[Series 501: t1_sag right · oblique · right · 3.0mm · 0.31mm/px · 3 of 30 slices shown]
[im 5/30]
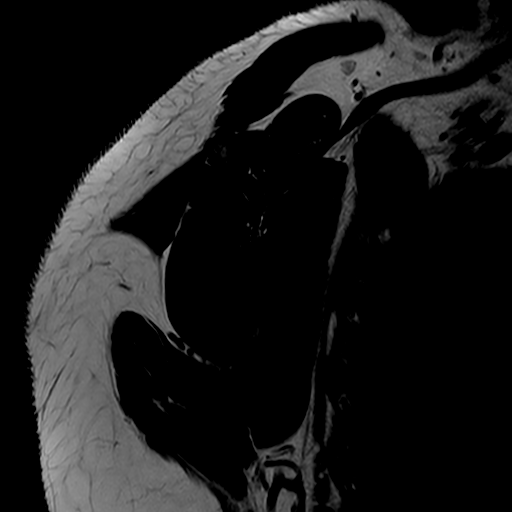
[im 17/30]
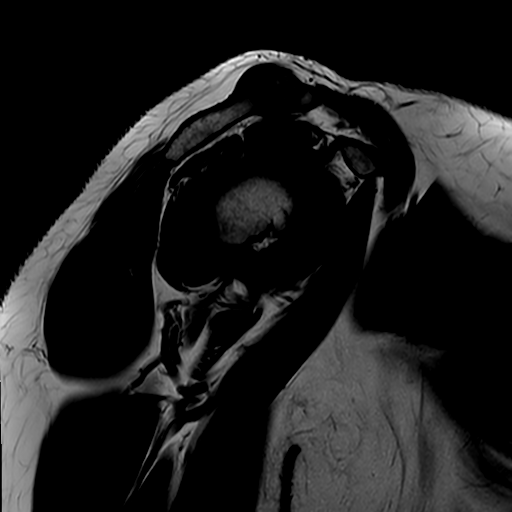
[im 25/30]
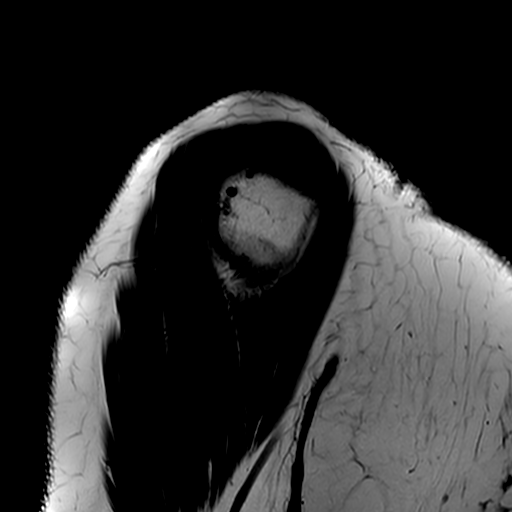

[23 of 40 positions shown; findings below may reference images not displayed]

FINDINGS: ROTATOR CUFF: 1 mm low-grade partial-thickness articular sided distal 
supraspinatus tendon tear. The infraspinatus, subscapularis and teres minor 
tendons are intact. The rotator cuff musculature is symmetric without mass, 
signal abnormality or atrophy. 
ACROMIOCLAVICULAR JOINT: Mild degenerative change of the acromioclavicular 
joint: The inferior joint abuts, but does not deform, the supraspinatus. The 
coracoacromial ligament is intact without prominent spurring at the acromial 
attachment. The acromioclavicular and coracoclavicular ligaments are preserved. 
The acromium is normal in morphology. 
GLENOHUMERAL JOINT: The humeral head is well located within the glenoid fossa. 
Articular cartilage is preserved.  Degenerative superior labral tear. No 
paralabral cyst. The intra-articular portion of the long head of the biceps 
tendon is negative. No shoulder joint effusion. 
BONES: The bone marrow signal intensity is negative for fracture. No Hill-Sachs 
defect. Subcortical cystic change of the humeral head. 
ADDITIONAL FINDINGS: Trace amount of fluid in the subacromial/subdeltoid bursa. 
The axillary region is negative. Subcutaneous tissues are negative.
IMPRESSION: 1.  Degenerative superior labral tear and small humeral head subcortical cysts 
without glenohumeral articular cartilage loss. 
2.  1 mm low-grade partial-thickness articular sided distal supraspinatus tendon 
tear. 
3.  Mild AC joint degenerative change.

## 2022-05-06 IMAGING — MG MAMMOGRAPHY SCREENING BILATERAL 3[PERSON_NAME]
8 series · 8 of 24 positions shown · non-contrast
Comparison: None available.

________________________________________________________________________________________________ 
MAMMOGRAPHY SCREENING BILATERAL 3SAW [facility]AGLE, 05/06/2022 [DATE]: 
CLINICAL INDICATION:  DN4.EN - Encounter for screening mammogram.
TECHNIQUE: Digital bilateral mammograms and 3-D Tomosynthesis were obtained. 
These were interpreted both primarily and with the aid of computer-aided 
detection system.  
BREAST DENSITY: (Level B) There are scattered areas of fibroglandular density.

[L MLO]
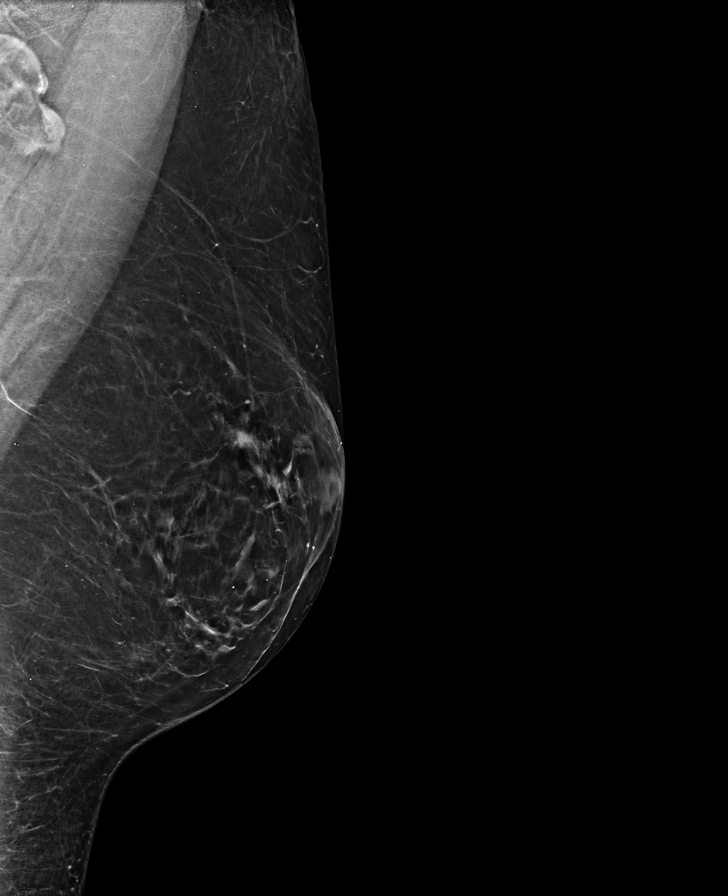

[R MLO]
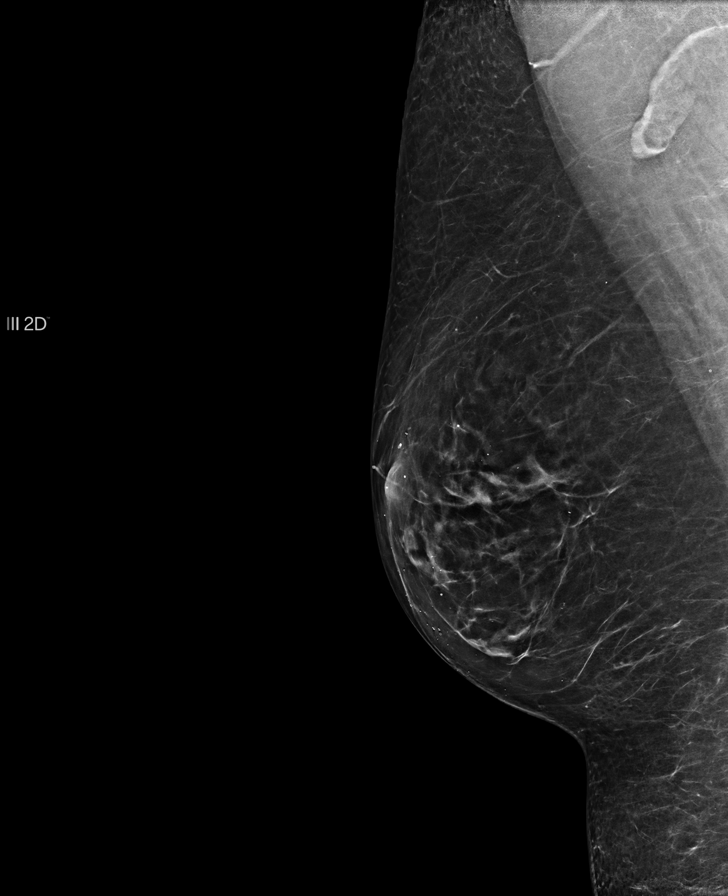

[R CC]
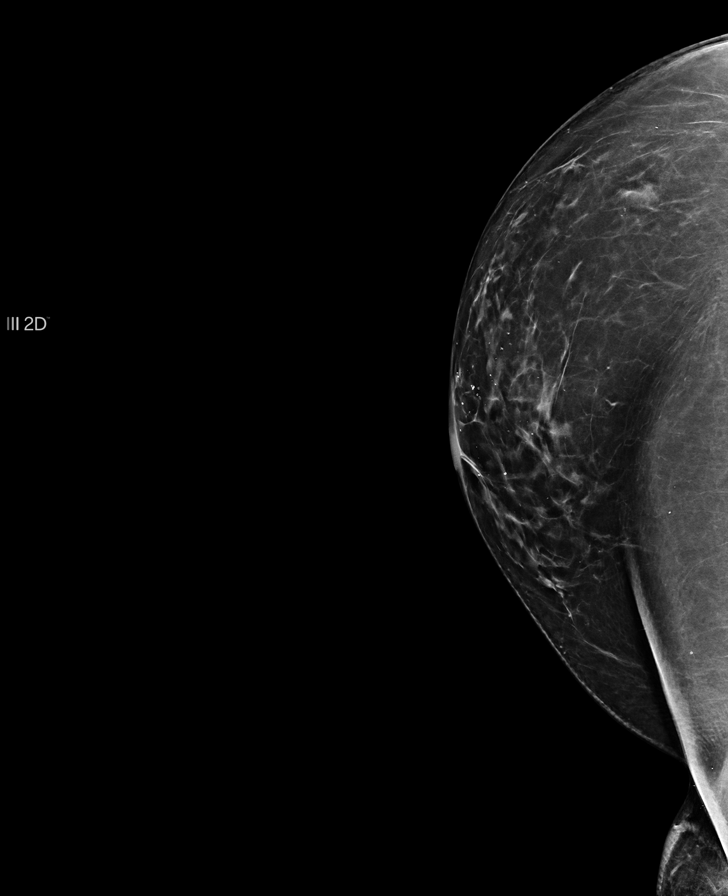

[L CC]
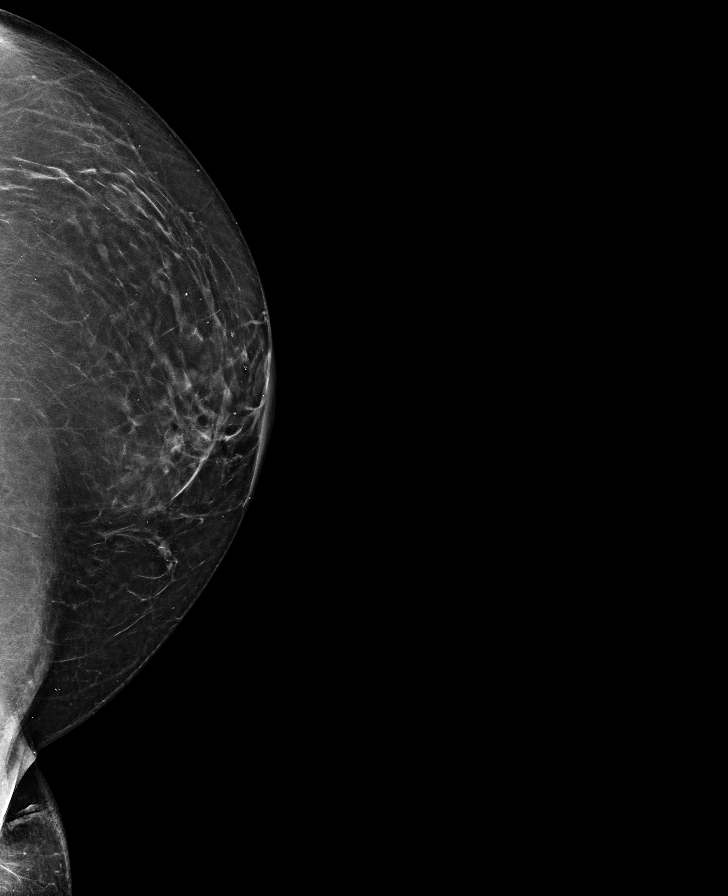

[R CC tomo · tomo slice 37/74.0]
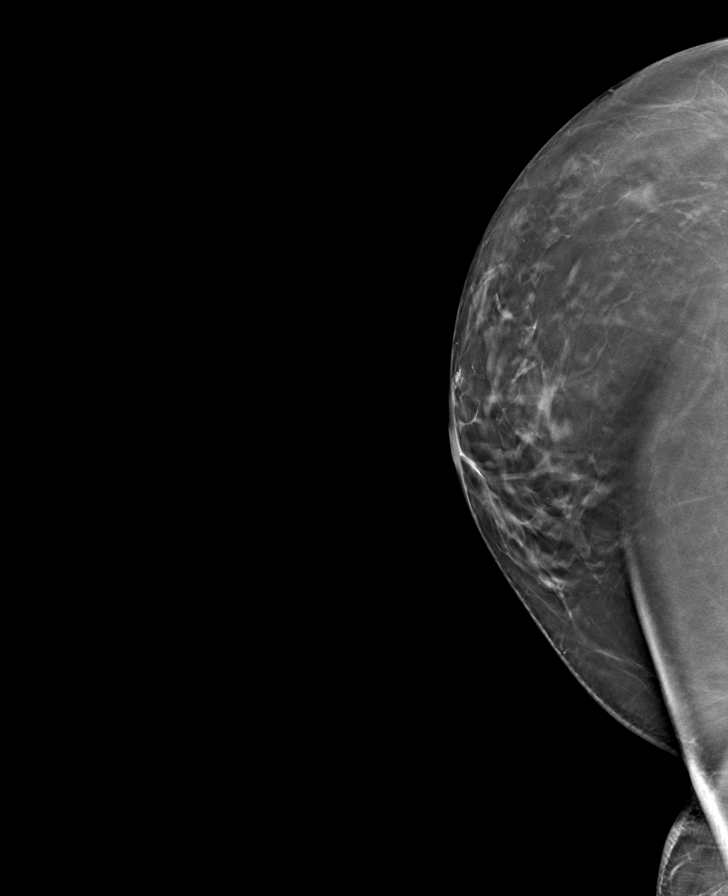

[L CC tomo · tomo slice 37/72.0]
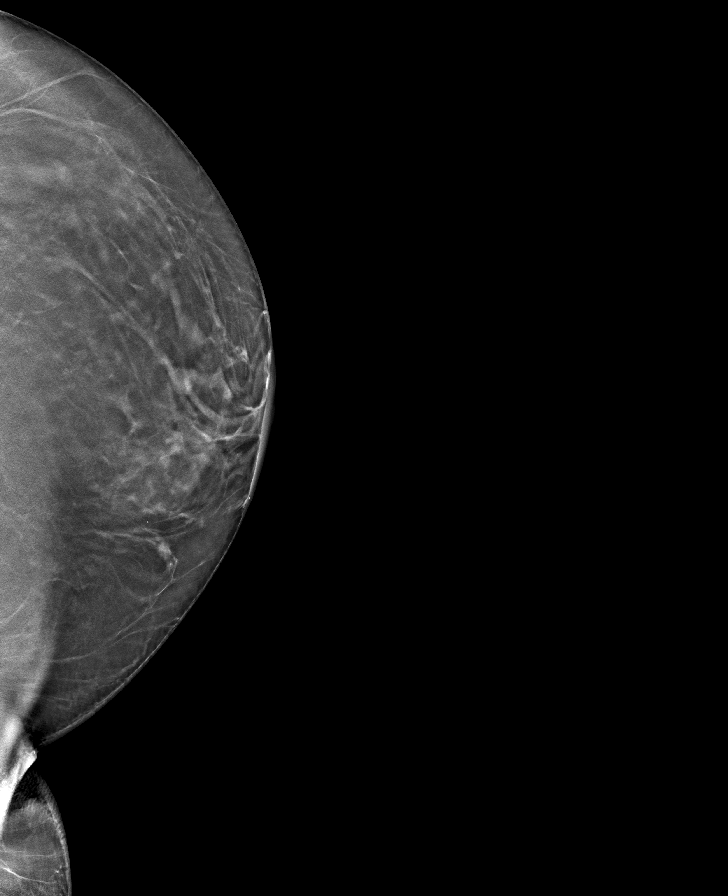

[L MLO tomo · tomo slice 38/75.0]
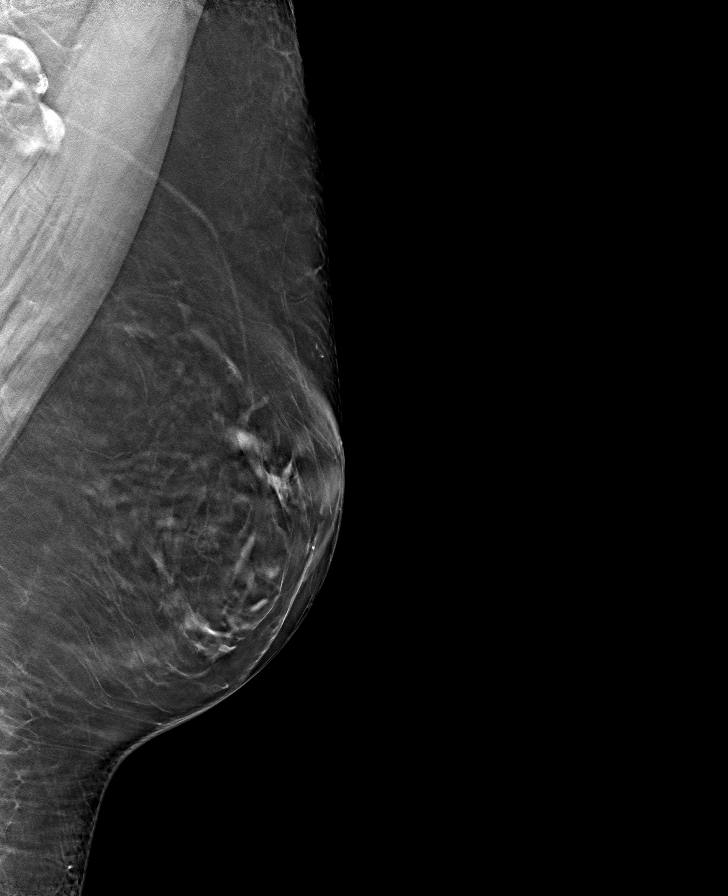

[R MLO tomo · tomo slice 37/73.0]
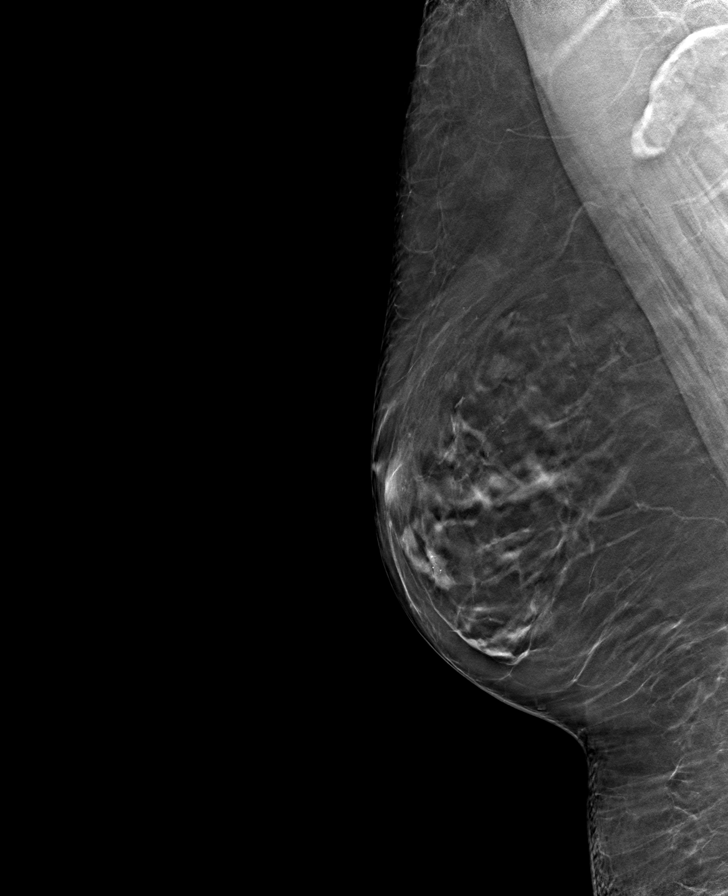

[8 of 24 positions shown; findings below may reference images not displayed]

FINDINGS: There are no mammographically suspicious findings.
IMPRESSION: (BI-RADS 1) Negative mammogram. Routine mammographic follow-up is recommended.

## 2022-12-01 IMAGING — MR MRI LUMBAR SPINE WITHOUT CONTRAST
7 of 8 series · 16 of 48 positions shown · IV contrast (gadolinium)
Comparison: None

________________________________________________________________________________________________ 
MRI LUMBAR SPINE WITHOUT CONTRAST, 12/01/2022 [DATE]: 
CLINICAL INDICATION: Left-sided back pain
TECHNIQUE: Sagittal T1, Sagittal T2, Sagittal STIR, Axial T1 and Axial T2 MR 
images of the lumbar spine were performed without intravenous gadolinium 
enhancement.

[Series 101: survey · axial · 10.0mm · 1.25mm/px · z∈[-33,+201]mm · 2 of 10 slices shown]
[im 1/10]
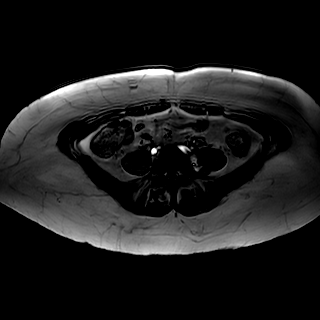
[im 10/10]
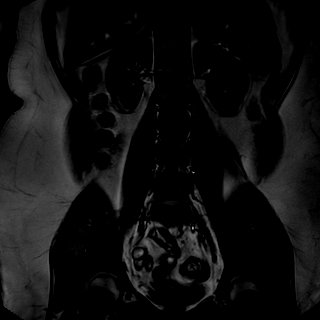

[Series 201: t2w_cor-surv · coronal · 6.0mm · 0.62mm/px · 1 of 10 slices shown]
[im 1/10]
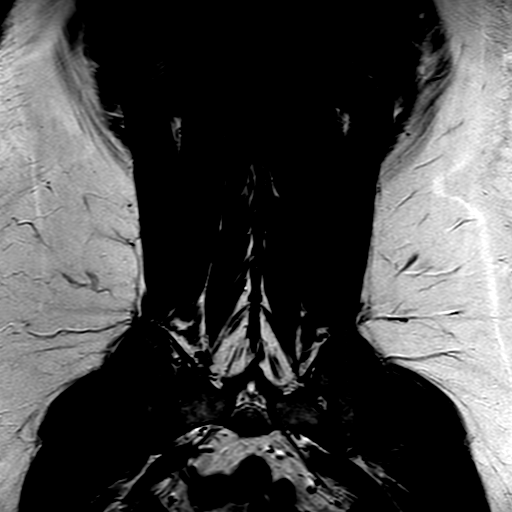

[Series 301: T1 · sagittal · 4.0mm · 0.44mm/px · 2 of 17 slices shown]
[im 1/17]
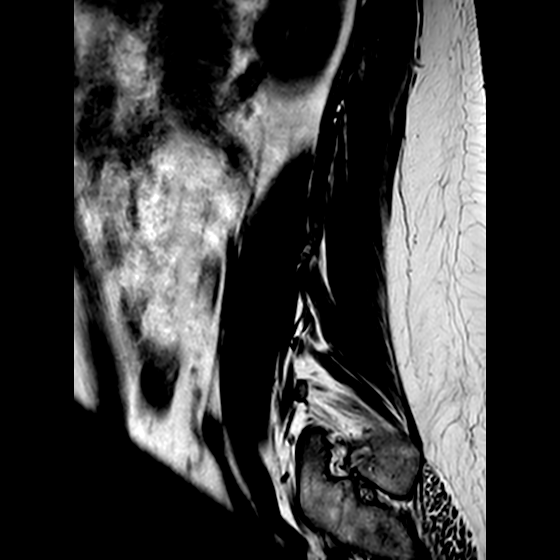
[im 17/17]
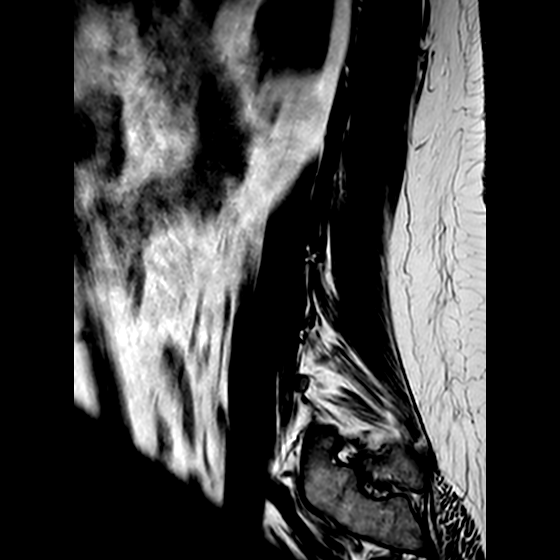

[Series 402: (id)_mdixon_tse · sagittal · 4.0mm · 0.38mm/px · 2 of 17 slices shown]
[im 1/17]
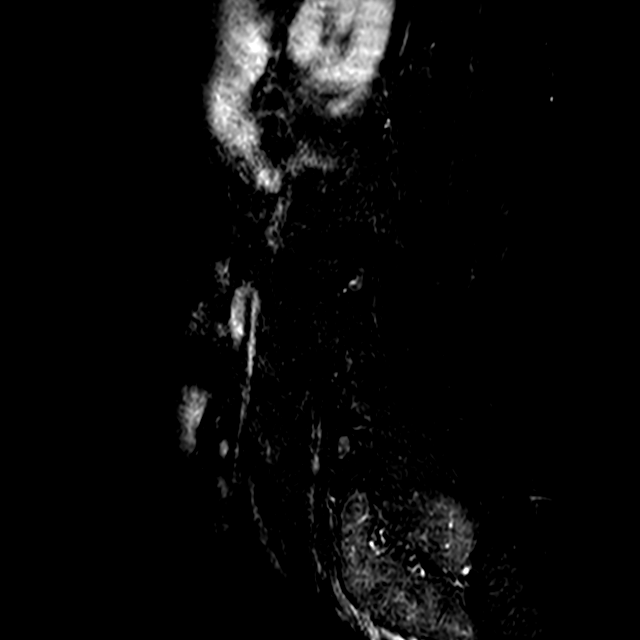
[im 17/17]
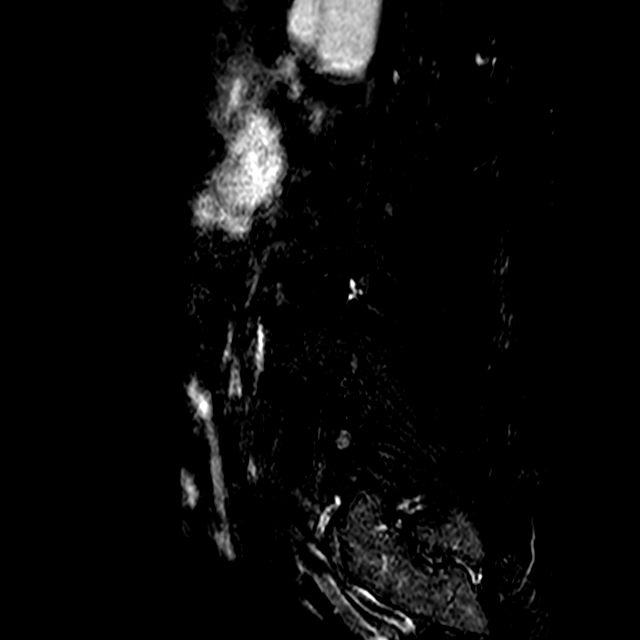

[Series 403: st2w_mdixon_tse · sagittal · 4.0mm · 0.38mm/px · 2 of 17 slices shown]
[im 1/17]
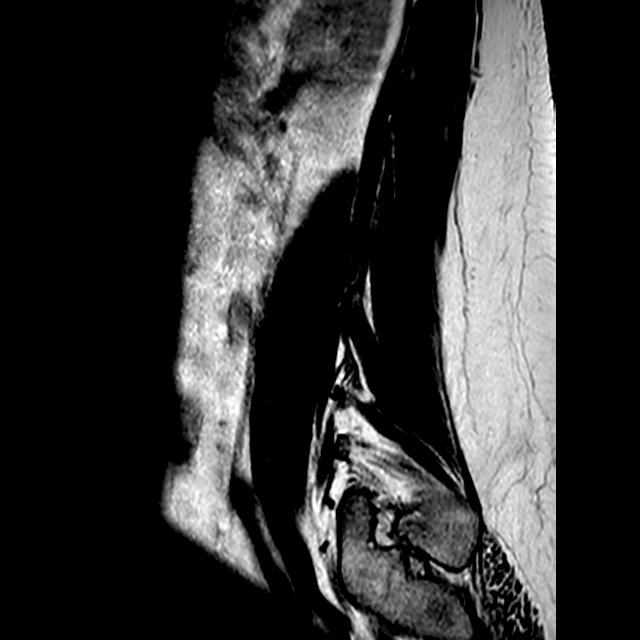
[im 17/17]
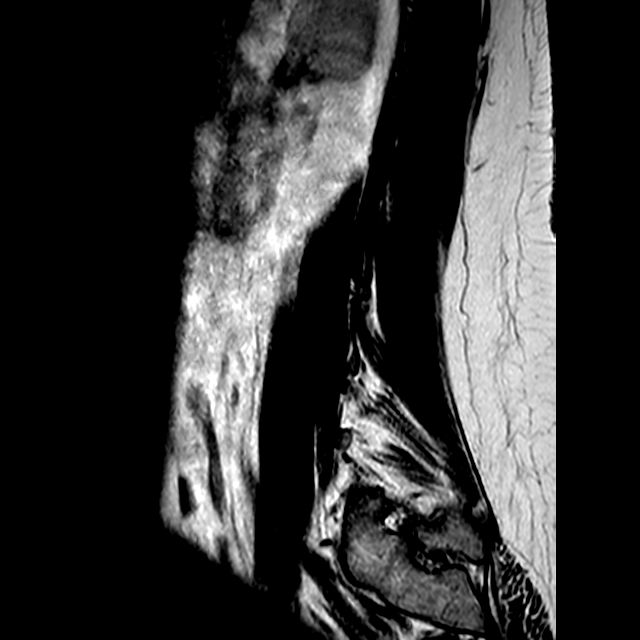

[Series 501: sag spineview ax · sagittal · 1.4mm · 0.36mm/px · 3 of 114 slices shown]
[im 8/114]
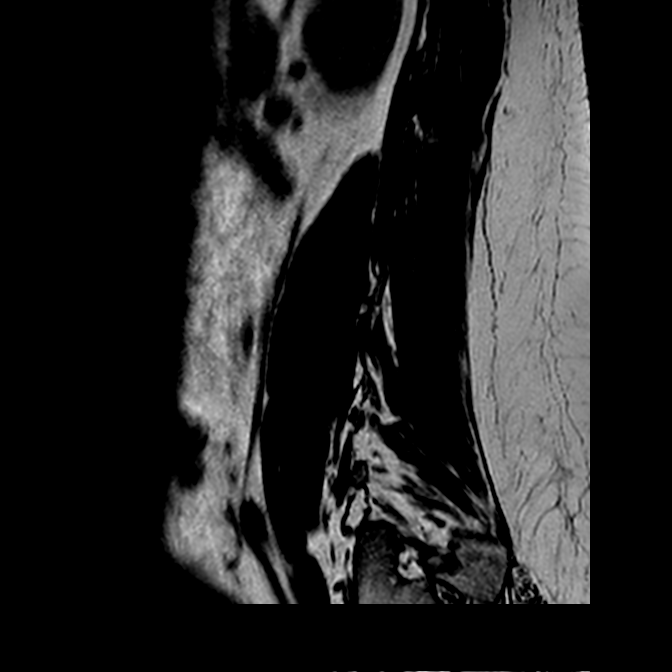
[im 23/114]
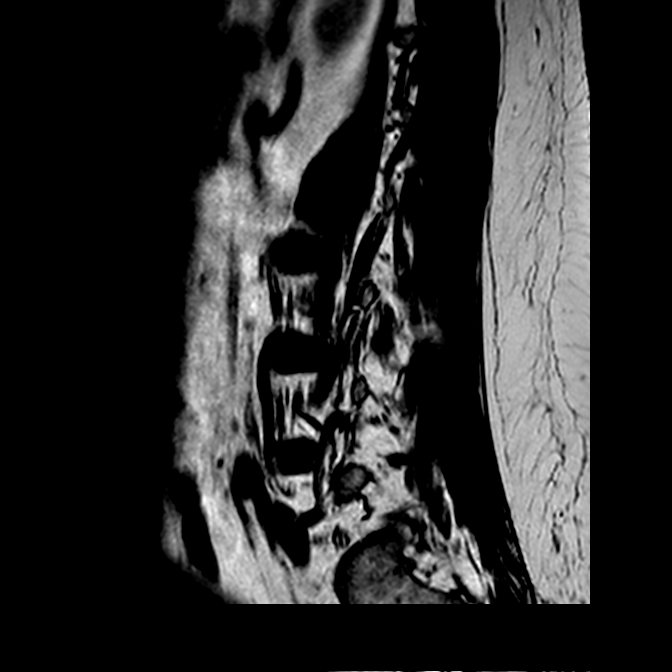
[im 38/114]
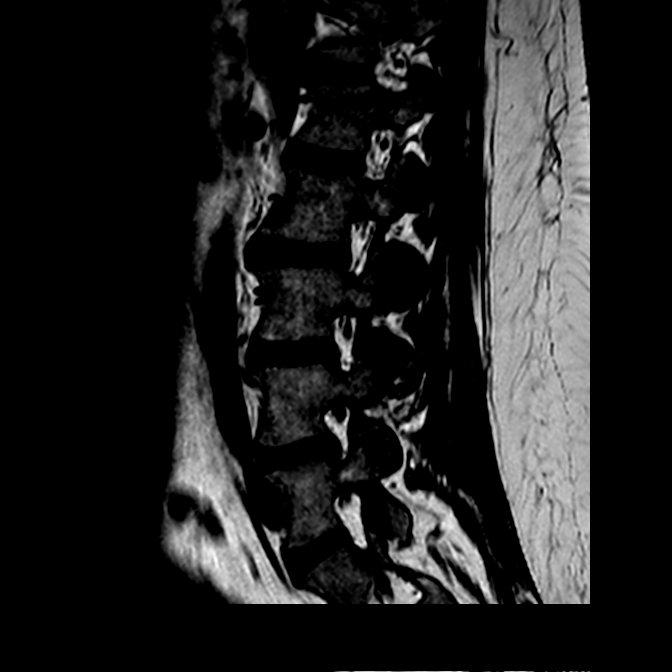

[Series 601: T2 · axial · 4.0mm · 0.30mm/px · z∈[-85,+136]mm · 4 of 30 slices shown]
[im 1/30]
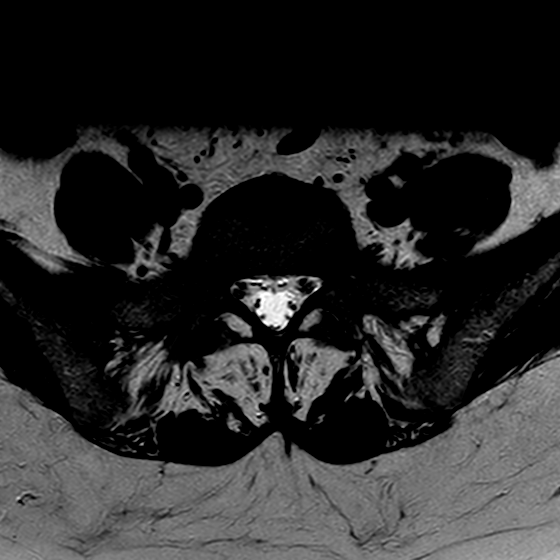
[im 10/30]
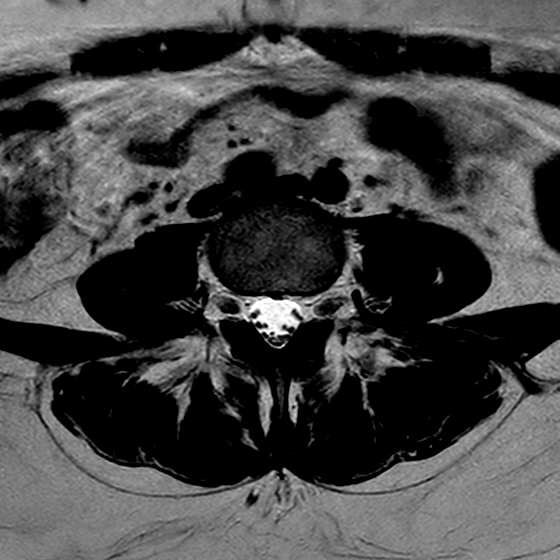
[im 20/30]
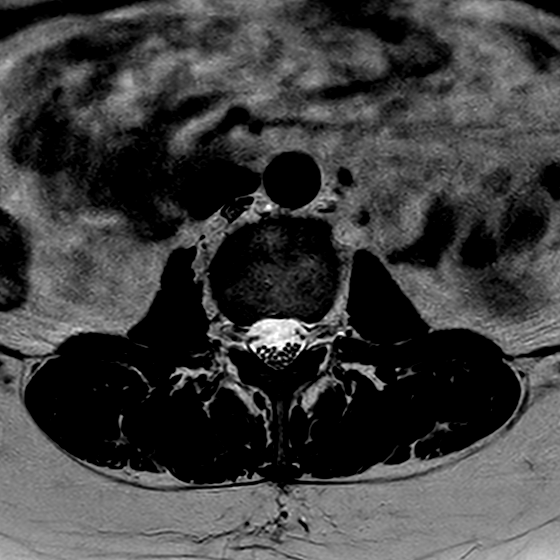
[im 30/30]
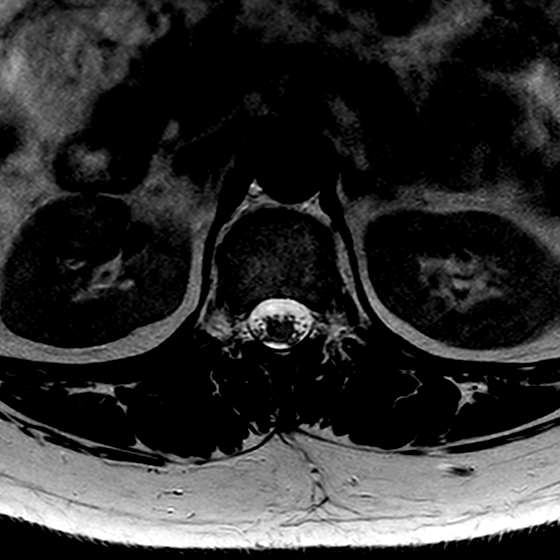

[16 of 48 positions shown; findings below may reference images not displayed]

FINDINGS: Lumbar vertebral heights are intact. There is no evidence for fracture 
or malignancy. No Modic type I change. Disc heights are preserved. There is no 
listhesis. 
There is no evidence for disc extrusion or lumbar canal stenosis. There is mild 
left L4-5 foraminal narrowing due to disc bulge, not appearing to produce nerve 
root impingement. Potentially this worsens with weightbearing. 
Minimal disc bulge at L2-3 without stenosis. 
Coronal localizer shows slight lumbar levocurvature.
IMPRESSION: Mild left L4-5 foraminal encroachment by disc bulge, not producing nerve root 
impingement with the patient supine. Possibly this worsens with weightbearing. 
There is no significant lumbar canal stenosis.  
No evidence for fracture, listhesis or Modic type I change. 
Mild to moderate facet degenerative changes throughout the lumbar spine.

## 2022-12-06 IMAGING — DX LUMBAR SPINE AP, LAT WITH FLEXION AND EXTEN
1 series · 4 of 4 positions shown · non-contrast
Comparison: None

________________________________________________________________________________________________ 
LUMBAR SPINE AP, LAT WITH FLEXION AND EXTEN, 12/06/2022 [DATE]: 
CLINICAL INDICATION:  Radiculopathy, low back pain after lifting injury

[Series 1: AP · 0.14mm/px · 4 of 4 slices shown]
[im 1/4]
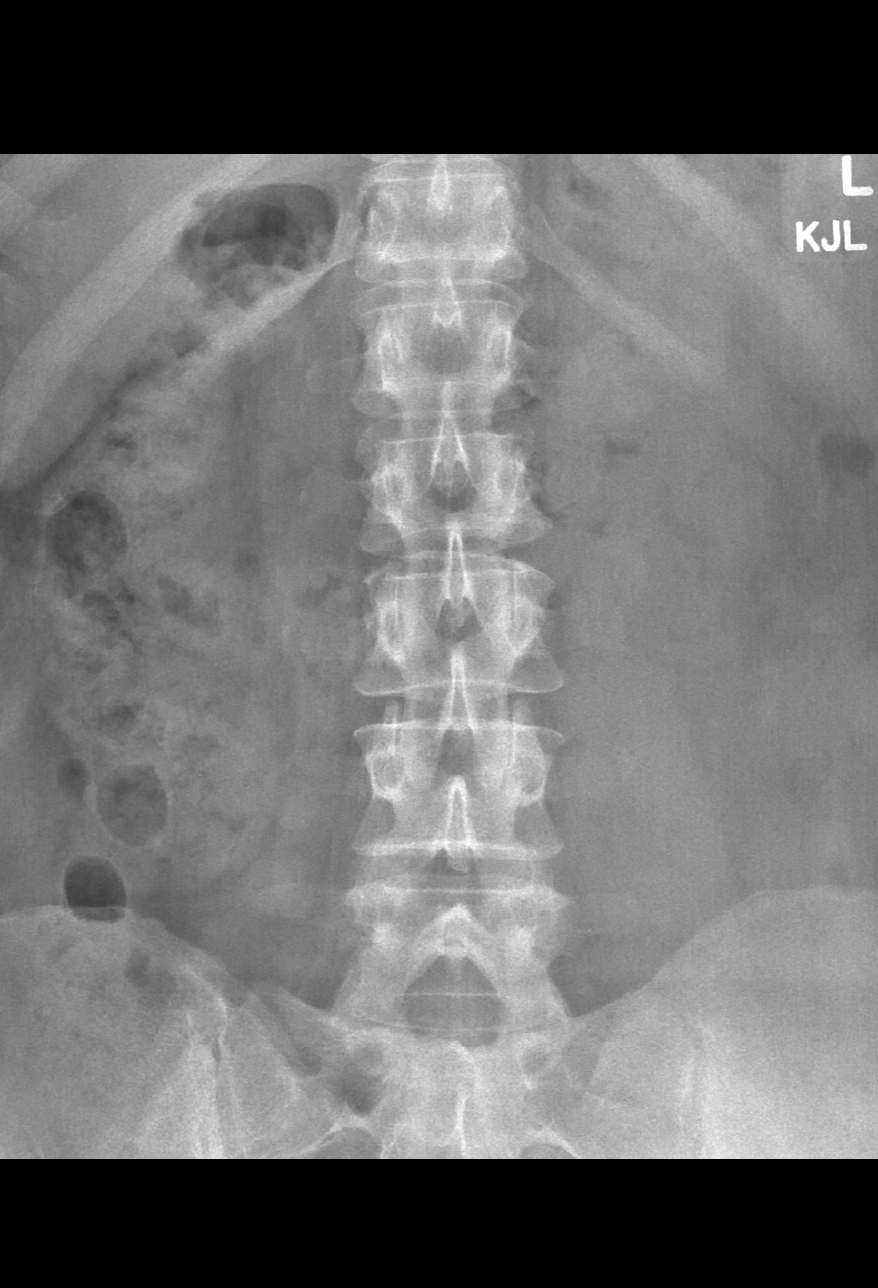
[im 2/4]
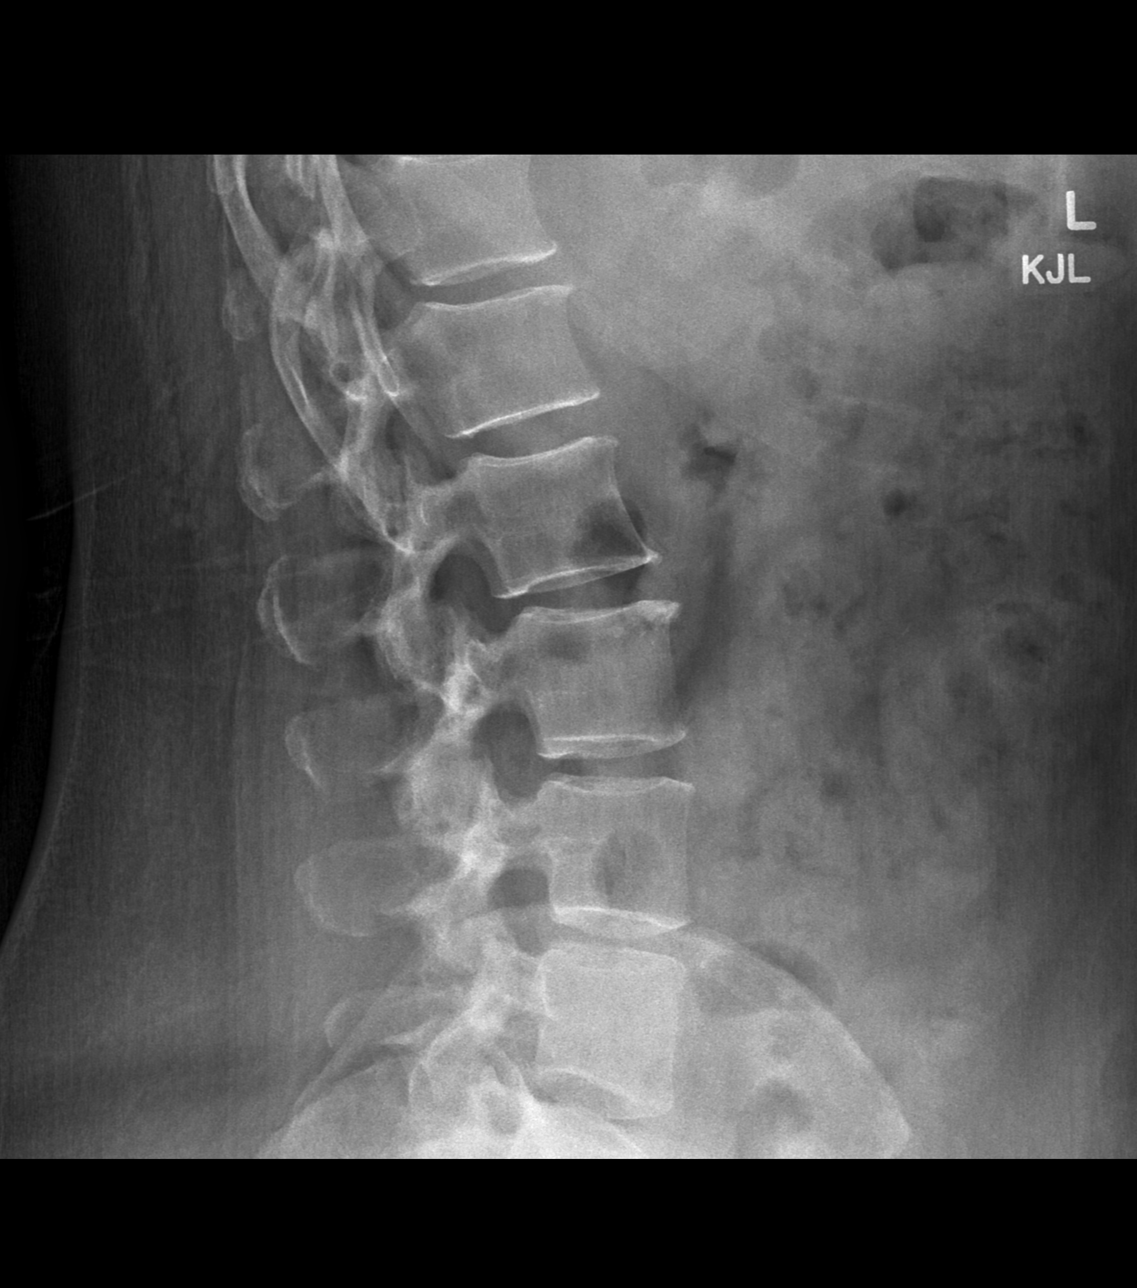
[im 3/4]
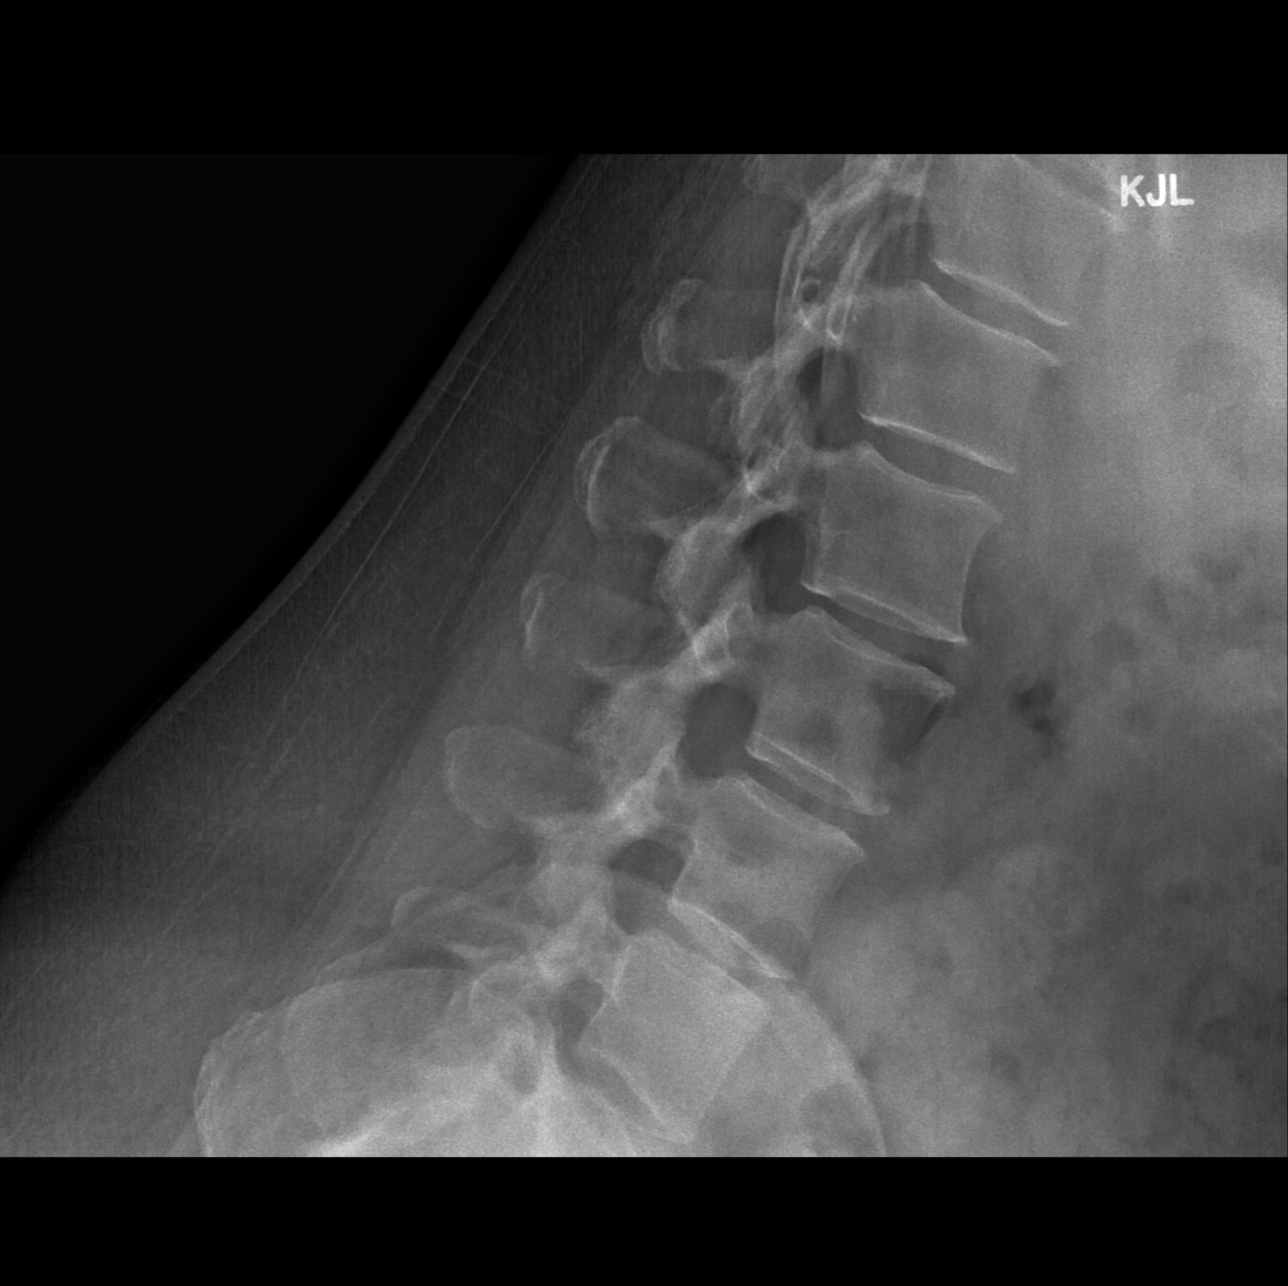
[im 4/4]
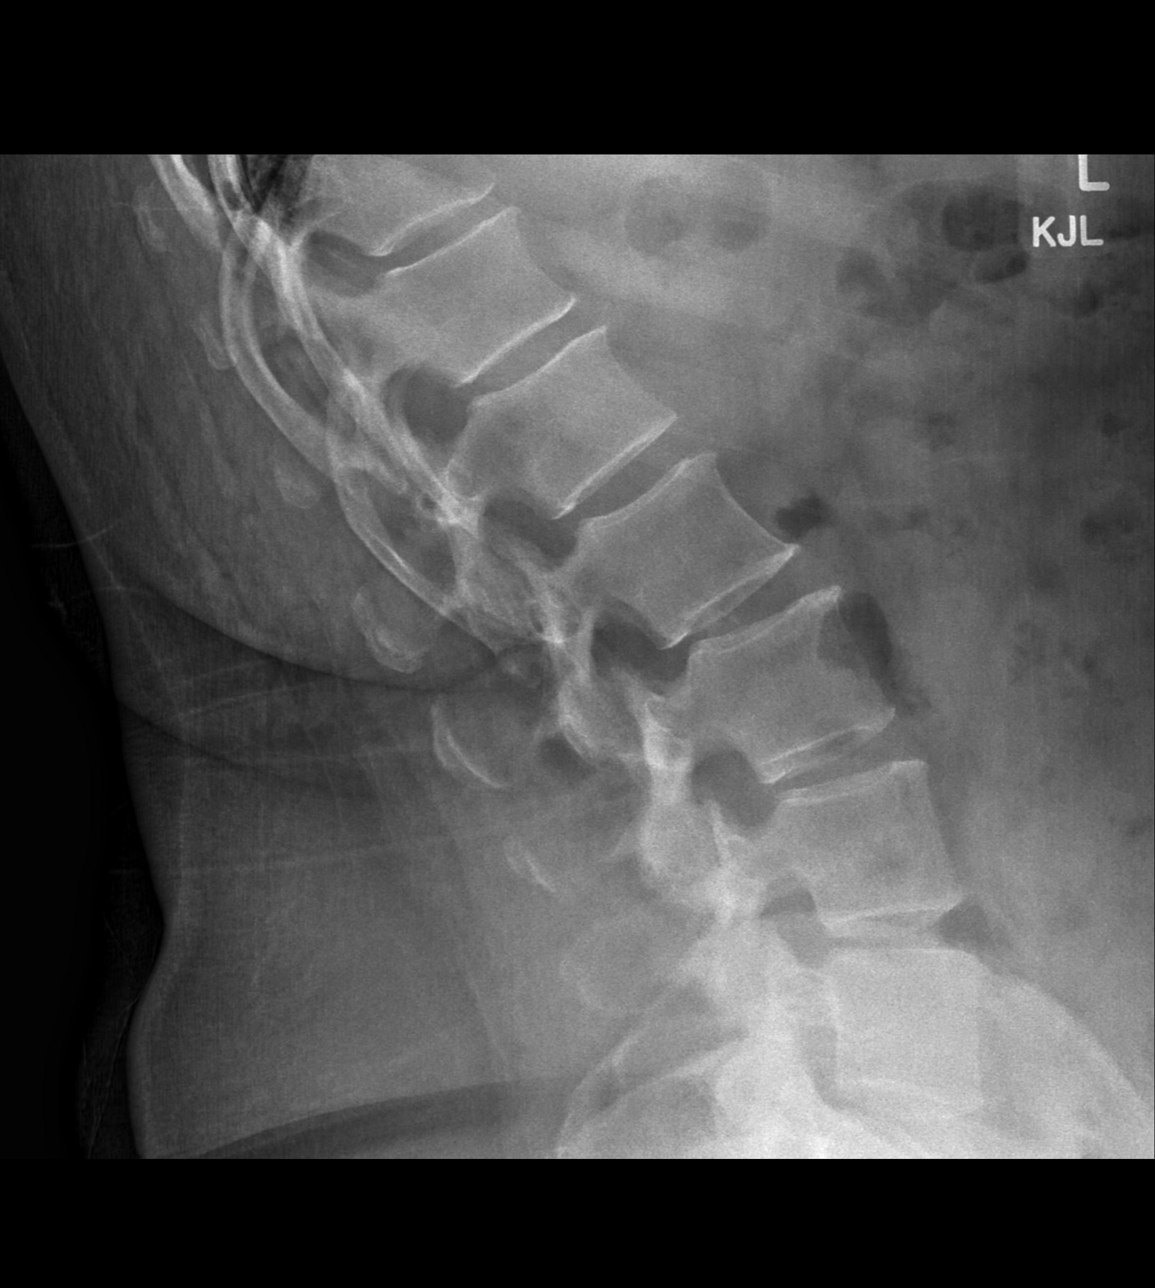

[4 of 4 positions shown; findings below may reference images not displayed]

FINDINGS: Lumbar vertebral heights are intact. There is no listhesis. Disc 
heights are relatively well-maintained for age. Moderate facet degenerative 
changes. Flexion-extension views show no dynamic subluxation. There is no 
scoliosis. Mild SI joint degenerative changes.
IMPRESSION: Degenerative changes appear most pronounced in the lumbar facet joints. MRI 
would be useful if indicated.
